# Patient Record
Sex: Male | Born: 1960 | Race: White | Hispanic: No | Marital: Married | Smoking: Never smoker
Health system: Southern US, Community
[De-identification: ages and names within clinical notes are randomized; demographics above are authoritative.]

## PROBLEM LIST (undated history)

## (undated) DIAGNOSIS — M545 Low back pain, unspecified: Secondary | ICD-10-CM

## (undated) DIAGNOSIS — R7303 Prediabetes: Secondary | ICD-10-CM

## (undated) DIAGNOSIS — F419 Anxiety disorder, unspecified: Secondary | ICD-10-CM

## (undated) DIAGNOSIS — M1990 Unspecified osteoarthritis, unspecified site: Secondary | ICD-10-CM

## (undated) DIAGNOSIS — F329 Major depressive disorder, single episode, unspecified: Secondary | ICD-10-CM

## (undated) DIAGNOSIS — F32A Depression, unspecified: Secondary | ICD-10-CM

## (undated) DIAGNOSIS — E785 Hyperlipidemia, unspecified: Secondary | ICD-10-CM

## (undated) DIAGNOSIS — D693 Immune thrombocytopenic purpura: Secondary | ICD-10-CM

## (undated) DIAGNOSIS — K589 Irritable bowel syndrome without diarrhea: Secondary | ICD-10-CM

## (undated) DIAGNOSIS — G4733 Obstructive sleep apnea (adult) (pediatric): Secondary | ICD-10-CM

## (undated) DIAGNOSIS — G8929 Other chronic pain: Secondary | ICD-10-CM

## (undated) HISTORY — DX: Low back pain, unspecified: M54.50

## (undated) HISTORY — DX: Depression, unspecified: F32.A

## (undated) HISTORY — DX: Other chronic pain: G89.29

## (undated) HISTORY — DX: Immune thrombocytopenic purpura: D69.3

## (undated) HISTORY — DX: Major depressive disorder, single episode, unspecified: F32.9

## (undated) HISTORY — DX: Irritable bowel syndrome, unspecified: K58.9

## (undated) HISTORY — DX: Low back pain: M54.5

## (undated) HISTORY — PX: TONSILLECTOMY: SUR1361

## (undated) HISTORY — PX: NASAL SINUS SURGERY: SHX719

## (undated) HISTORY — PX: COLONOSCOPY: SHX174

## (undated) HISTORY — DX: Obstructive sleep apnea (adult) (pediatric): G47.33

## (undated) HISTORY — DX: Hyperlipidemia, unspecified: E78.5

## (undated) HISTORY — PX: LAMINECTOMY: SHX219

## (undated) HISTORY — DX: Anxiety disorder, unspecified: F41.9

## (undated) HISTORY — PX: SPINE SURGERY: SHX786

---

## 1998-05-10 ENCOUNTER — Ambulatory Visit (HOSPITAL_COMMUNITY): Admission: RE | Admit: 1998-05-10 | Discharge: 1998-05-10 | Payer: Self-pay | Admitting: Neurosurgery

## 1998-05-21 ENCOUNTER — Inpatient Hospital Stay (HOSPITAL_COMMUNITY): Admission: RE | Admit: 1998-05-21 | Discharge: 1998-05-25 | Payer: Self-pay | Admitting: Neurosurgery

## 1998-08-07 ENCOUNTER — Emergency Department (HOSPITAL_COMMUNITY): Admission: EM | Admit: 1998-08-07 | Discharge: 1998-08-07 | Payer: Self-pay | Admitting: Emergency Medicine

## 1998-12-24 ENCOUNTER — Ambulatory Visit (HOSPITAL_COMMUNITY): Admission: RE | Admit: 1998-12-24 | Discharge: 1998-12-24 | Payer: Self-pay | Admitting: Neurosurgery

## 1998-12-24 ENCOUNTER — Encounter: Payer: Self-pay | Admitting: Neurosurgery

## 2001-01-17 ENCOUNTER — Encounter: Payer: Self-pay | Admitting: Family Medicine

## 2001-01-17 ENCOUNTER — Encounter: Admission: RE | Admit: 2001-01-17 | Discharge: 2001-01-17 | Payer: Self-pay | Admitting: Family Medicine

## 2004-01-04 ENCOUNTER — Emergency Department (HOSPITAL_COMMUNITY): Admission: EM | Admit: 2004-01-04 | Discharge: 2004-01-04 | Payer: Self-pay | Admitting: Emergency Medicine

## 2011-01-17 ENCOUNTER — Encounter: Payer: Self-pay | Admitting: Neurosurgery

## 2011-01-19 ENCOUNTER — Encounter: Payer: Self-pay | Admitting: Internal Medicine

## 2011-03-18 ENCOUNTER — Encounter: Payer: Self-pay | Admitting: Pulmonary Disease

## 2011-03-19 ENCOUNTER — Encounter: Payer: Self-pay | Admitting: Pulmonary Disease

## 2011-03-19 ENCOUNTER — Ambulatory Visit (INDEPENDENT_AMBULATORY_CARE_PROVIDER_SITE_OTHER): Payer: BC Managed Care – PPO | Admitting: Pulmonary Disease

## 2011-03-19 VITALS — BP 120/80 | HR 76 | Temp 99.0°F | Ht 70.0 in | Wt 209.8 lb

## 2011-03-19 DIAGNOSIS — G4733 Obstructive sleep apnea (adult) (pediatric): Secondary | ICD-10-CM | POA: Insufficient documentation

## 2011-03-19 NOTE — Patient Instructions (Signed)
Will put your machine on auto mode for next 2-3 weeks, and get download to optimize your pressure.  Will let you know the results.  Please call us when the machine is downloaded so we can track down. Work on weight loss

## 2011-03-19 NOTE — Assessment & Plan Note (Signed)
The pt has a h/o moderate osa by his prior sleep study, and has been on cpap for this.  He is now having occasional breakthru snoring, and doesn't feel he is as rested as he should be.  He really doesn't describe true daytime sleepiness unless he is doing quite activities such as reading or watching "uninteresting tv", and feels he has more of a concentration issue?  He has not had his pressure optimized since original study, and has gained weight.  I think the first step here is to optimize his pressure on auto and see if he improves.

## 2011-03-19 NOTE — Progress Notes (Signed)
Subjective:    Patient ID: Russell Hayden, male    DOB: July 26, 1961, 50 y.o.   MRN: [id number]  HPI The pt is a 50y/o male who I have been asked to see for management of moderate osa.  Please see sleep questionnaire further in the note.  He is wearing cpap compliantly at 10cm, and is using a nasal mask.  He has a new machine, and has kept up with mask changes.  He averages 10hrs a night with cpap, but at times does not feel rested upon arising.  He has an accelerometer that he sometimes uses as actigraphy, and reports it shows 10-20 arousals during the night??.he only knows of 2-3 time a night.  He denies having true daytime sleepiness with periods of inactivity, although states his epworth is 17.  He denies sleepiness at his desk at [employer], computers, meetings, etc.  He will fall asleep if tries to read a book.  He does not feel he has true daytime SLEEPINESS.  His weight is up 25 pounds since his sleep study.  He is currently taking nuvigil during day as needed, and doesn't feel it helps except in afternoons when not busy.    Review of Systems  Constitutional: Negative for fever, appetite change and unexpected weight change.  HENT: Negative for ear pain, congestion, sore throat, rhinorrhea, sneezing, trouble swallowing, dental problem and postnasal drip.   Eyes: Negative for redness.  Respiratory: Negative for cough, shortness of breath and wheezing.   Cardiovascular: Negative for chest pain, palpitations and leg swelling.  Gastrointestinal: Negative for nausea, abdominal pain and diarrhea.  Genitourinary: Negative for dysuria.  Musculoskeletal: Positive for joint swelling.  Skin: Negative for rash.  Neurological: Negative for syncope and headaches.  Hematological: Does not bruise/bleed easily.  Psychiatric/Behavioral: Positive for dysphoric mood. The patient is nervous/anxious.        Objective:   Physical Exam  Constitutional: He is oriented to person, place, and time. No distress.   Ow male  HENT:  Head: Atraumatic.  Nose: Nose normal.  Mouth/Throat: Oropharyngeal exudate present.       Trimmed palate and uvula  Eyes: EOM are normal. Pupils are equal, round, and reactive to light.  Neck: Neck supple. No JVD present. No tracheal deviation present. No thyromegaly present.  Cardiovascular: Normal rate, regular rhythm, normal heart sounds and intact distal pulses.  Exam reveals no gallop and no friction rub.   No murmur heard. Pulmonary/Chest: Breath sounds normal. No respiratory distress. He has no wheezes. He has no rales.  Abdominal: Soft. Bowel sounds are normal. There is no tenderness.  Musculoskeletal: He exhibits no edema.  Lymphadenopathy:    He has no cervical adenopathy.  Neurological: He is alert and oriented to person, place, and time.       Moves all 4, does not appear sleepy  Skin:       No cyanosis          Assessment & Plan:  OSA (obstructive sleep apnea) The pt has a h/o moderate osa by his prior sleep study, and has been on cpap for this.  He is now having occasional breakthru snoring, and doesn't feel he is as rested as he should be.  He really doesn't describe true daytime sleepiness unless he is doing quite activities such as reading or watching "uninteresting tv", and feels he has more of a concentration issue?  He has not had his pressure optimized since original study, and has gained weight.  I think the  first step here is to optimize his pressure on auto and see if he improves.

## 2011-04-13 ENCOUNTER — Encounter: Payer: Self-pay | Admitting: Pulmonary Disease

## 2011-09-27 ENCOUNTER — Other Ambulatory Visit: Payer: Self-pay | Admitting: Pulmonary Disease

## 2011-09-27 DIAGNOSIS — G4733 Obstructive sleep apnea (adult) (pediatric): Secondary | ICD-10-CM

## 2011-11-26 ENCOUNTER — Other Ambulatory Visit: Payer: Self-pay | Admitting: Internal Medicine

## 2011-11-26 DIAGNOSIS — R0989 Other specified symptoms and signs involving the circulatory and respiratory systems: Secondary | ICD-10-CM

## 2011-11-30 ENCOUNTER — Ambulatory Visit
Admission: RE | Admit: 2011-11-30 | Discharge: 2011-11-30 | Disposition: A | Discharge status: Home / Self Care | Payer: BC Managed Care – PPO | Source: Ambulatory Visit | Attending: Internal Medicine | Admitting: Internal Medicine

## 2011-11-30 DIAGNOSIS — R0989 Other specified symptoms and signs involving the circulatory and respiratory systems: Secondary | ICD-10-CM

## 2012-10-23 ENCOUNTER — Ambulatory Visit: Payer: BC Managed Care – PPO

## 2012-10-23 ENCOUNTER — Ambulatory Visit (INDEPENDENT_AMBULATORY_CARE_PROVIDER_SITE_OTHER): Payer: BC Managed Care – PPO | Admitting: Internal Medicine

## 2012-10-23 VITALS — BP 124/76 | HR 91 | Temp 98.3°F | Resp 16 | Ht 69.5 in | Wt 218.4 lb

## 2012-10-23 DIAGNOSIS — S93529A Sprain of metatarsophalangeal joint of unspecified toe(s), initial encounter: Secondary | ICD-10-CM

## 2012-10-23 DIAGNOSIS — M79673 Pain in unspecified foot: Secondary | ICD-10-CM

## 2012-10-23 DIAGNOSIS — S96912A Strain of unspecified muscle and tendon at ankle and foot level, left foot, initial encounter: Secondary | ICD-10-CM

## 2012-10-23 DIAGNOSIS — M79609 Pain in unspecified limb: Secondary | ICD-10-CM

## 2012-10-23 NOTE — Progress Notes (Signed)
  Subjective:    Patient ID: Russell Hayden, male    DOB: 1961-08-31, 51 y.o.   MRN: [id number]  HPI At home last week and twisted foot in attic. Has pain mid foot when walks. No loss rom.  Review of Systems     Objective:   Physical Exam Left foot not swollen, nmsv intact, full rom Point tender mid foot mid metatarsals  [facility] reading (PRIMARY) by  Dr.Nikolette Reindl. No fx          Assessment & Plan:  Left foot strain camwalker

## 2012-10-23 NOTE — Patient Instructions (Addendum)
Foot Sprain The muscles and cord like structures which attach muscle to bone (tendons) that surround the feet are made up of units. A foot sprain can occur at the weakest spot in any of these units. This condition is most often caused by injury to or overuse of the foot, as from playing contact sports, or aggravating a previous injury, or from poor conditioning, or obesity. SYMPTOMS  Pain with movement of the foot.  Tenderness and swelling at the injury site.  Loss of strength is present in moderate or severe sprains. THE THREE GRADES OR SEVERITY OF FOOT SPRAIN ARE:  Mild (Grade I): Slightly pulled muscle without tearing of muscle or tendon fibers or loss of strength.  Moderate (Grade II): Tearing of fibers in a muscle, tendon, or at the attachment to bone, with small decrease in strength.  Severe (Grade III): Rupture of the muscle-tendon-bone attachment, with separation of fibers. Severe sprain requires surgical repair. Often repeating (chronic) sprains are caused by overuse. Sudden (acute) sprains are caused by direct injury or over-use. DIAGNOSIS  Diagnosis of this condition is usually by your own observation. If problems continue, a caregiver may be required for further evaluation and treatment. X-rays may be required to make sure there are not breaks in the bones (fractures) present. Continued problems may require physical therapy for treatment. PREVENTION  Use strength and conditioning exercises appropriate for your sport.  Warm up properly prior to working out.  Use athletic shoes that are made for the sport you are participating in.  Allow adequate time for healing. Early return to activities makes repeat injury more likely, and can lead to an unstable arthritic foot that can result in prolonged disability. Mild sprains generally heal in 3 to 10 days, with moderate and severe sprains taking 2 to 10 weeks. Your caregiver can help you determine the proper time required for  healing. HOME CARE INSTRUCTIONS   Apply ice to the injury for 15 to 20 minutes, 3 to 4 times per day. Put the ice in a plastic bag and place a towel between the bag of ice and your skin.  An elastic wrap (like an Ace bandage) may be used to keep swelling down.  Keep foot above the level of the heart, or at least raised on a footstool, when swelling and pain are present.  Try to avoid use other than gentle range of motion while the foot is painful. Do not resume use until instructed by your caregiver. Then begin use gradually, not increasing use to the point of pain. If pain does develop, decrease use and continue the above measures, gradually increasing activities that do not cause discomfort, until you gradually achieve normal use.  Use crutches if and as instructed, and for the length of time instructed.  Keep injured foot and ankle wrapped between treatments.  Massage foot and ankle for comfort and to keep swelling down. Massage from the toes up towards the knee.  Only take over-the-counter or prescription medicines for pain, discomfort, or fever as directed by your caregiver. SEEK IMMEDIATE MEDICAL CARE IF:   Your pain and swelling increase, or pain is not controlled with medications.  You have loss of feeling in your foot or your foot turns cold or blue.  You develop new, unexplained symptoms, or an increase of the symptoms that brought you to your caregiver. MAKE SURE YOU:   Understand these instructions.  Will watch your condition.  Will get help right away if you are not doing well or   get worse. Document Released: 06/05/2002 Document Revised: 03/07/2012 Document Reviewed: 08/02/2008 [employer] Patient Information 2013 [employer].  

## 2013-01-30 ENCOUNTER — Ambulatory Visit: Payer: BC Managed Care – PPO

## 2013-01-30 ENCOUNTER — Ambulatory Visit (INDEPENDENT_AMBULATORY_CARE_PROVIDER_SITE_OTHER): Payer: BC Managed Care – PPO | Admitting: Family Medicine

## 2013-01-30 ENCOUNTER — Other Ambulatory Visit: Payer: Self-pay | Admitting: Family Medicine

## 2013-01-30 VITALS — BP 116/73 | HR 82 | Temp 98.0°F | Resp 18 | Ht 69.0 in | Wt 208.0 lb

## 2013-01-30 DIAGNOSIS — M25569 Pain in unspecified knee: Secondary | ICD-10-CM

## 2013-01-30 DIAGNOSIS — D649 Anemia, unspecified: Secondary | ICD-10-CM

## 2013-01-30 LAB — POCT CBC
Lymph, poc: 1.5 (ref 0.6–3.4)
MCHC: 31.2 g/dL — AB (ref 31.8–35.4)
MID (cbc): 0.4 (ref 0–0.9)
MPV: 8.3 fL (ref 0–99.8)
POC Granulocyte: 2.4 (ref 2–6.9)
POC LYMPH PERCENT: 34.8 %L (ref 10–50)
POC MID %: 8.6 %M (ref 0–12)
Platelet Count, POC: 199 10*3/uL (ref 142–424)
RDW, POC: 13.8 %

## 2013-01-30 LAB — URIC ACID: Uric Acid, Serum: 5.2 mg/dL (ref 4.0–7.8)

## 2013-01-30 NOTE — Progress Notes (Signed)
[facility] [address] [place] [address] [id number] [phone]  Date:  01/30/2013   Name:  Russell Hayden   DOB:  [birth year]   MRN:  [id number]  PCP:  Londell Moh, MD    Chief Complaint: Knee Pain   History of Present Illness:  Russell Hayden is a 52 y.o. very pleasant male patient who presents with the following:  He is here today with a left knee problem.  He has noted pain and stiffness in the knee for a couple of months, but worse for the last 2 days.  It feels unstable, but has not actually failed or given way.  He has not had any particular injury to the knee.   He does a lot of walking for exercise- about 8 miles a day.  He also played golf yesterday and that put a lot of pressure on his knee.  He took medication yesterday prior to his golf game- diclofenac  He has been diagnosed with OA in the past and does tend to have some generalized joint pains.  He had spine surgery and "rods" as a young man- no current orthopedic care.    Patient Active Problem List  Diagnosis  . OSA (obstructive sleep apnea)    Past Medical History  Diagnosis Date  . Hyperlipidemia   . OSA (obstructive sleep apnea)   . IBS (irritable bowel syndrome)   . Anxiety   . Depression   . Chronic low back pain   . Allergic rhinitis   . Idiopathic thrombocytopenia     Past Surgical History  Procedure Date  . Laminectomy     approx 2000  . Nasal sinus surgery     approx late 1990s.  deviated septum  . Spine surgery     History  Substance Use Topics  . Smoking status: Never Smoker   . Smokeless tobacco: Not on file  . Alcohol Use: Not on file    Family History  Problem Relation Age of Onset  . Hyperlipidemia Mother   . Hypertension Mother   . Celiac disease Mother   . Hypertension Brother   . Celiac disease Brother   . Leukemia Father   . Skin cancer Father   . Cancer Paternal Grandmother   . Asthma Mother   . Allergies Mother     Allergies  Allergen  Reactions  . Penicillins     Itching and rash    Medication list has been reviewed and updated.  Current Outpatient Prescriptions on File Prior to Visit  Medication Sig Dispense Refill  . Armodafinil (NUVIGIL) 150 MG tablet Take 150 mg by mouth daily.        . fish oil-omega-3 fatty acids 1000 MG capsule Take 2 g by mouth daily.       . rosuvastatin (CRESTOR) 20 MG tablet Take 20 mg by mouth daily.        . sertraline (ZOLOFT) 100 MG tablet Take 100 mg by mouth daily.          Review of Systems:  As per HPI- otherwise negative.   Physical Examination: Filed Vitals:   01/30/13 0953  BP: 116/73  Pulse: 82  Temp: 98 F (36.7 C)  Resp: 18   Filed Vitals:   01/30/13 0953  Height: 5\' 9"  (1.753 m)  Weight: 208 lb (94.348 kg)   Body mass index is 30.72 kg/(m^2). Ideal Body Weight: Weight in (lb) to have BMI = 25: 168.9   GEN:  WDWN, NAD, Non-toxic, A & O x 3, overweight HEENT: Atraumatic, Normocephalic. Neck supple. No masses, No LAD. Ears and Nose: No external deformity. CV: RRR, No M/G/R. No JVD. No thrill. No extra heart sounds. PULM: CTA B, no wheezes, crackles, rhonchi. No retractions. No resp. distress. No accessory muscle use. EXTR: No c/c/e NEURO Normal gait.  PSYCH: Normally interactive. Conversant. Not depressed or anxious appearing.  Calm demeanor.  Left knee: pain with flexion, able to extend fully.  Knee is slightly warm but not red. He has pain with valgus stress, varus stress ok.  Negative anterior drawer.    [facility] reading (PRIMARY) by  Dr. Patsy Lager. Left knee: minimal to no degenerative change  Results for orders placed in visit on 01/30/13  POCT CBC      Component Value Range   WBC 4.2 (*) 4.6 - 10.2 K/uL   Lymph, poc 1.5  0.6 - 3.4   POC LYMPH PERCENT 34.8  10 - 50 %L   MID (cbc) 0.4  0 - 0.9   POC MID % 8.6  0 - 12 %M   POC Granulocyte 2.4  2 - 6.9   Granulocyte percent 56.6  37 - 80 %G   RBC 4.66 (*) 4.69 - 6.13 M/uL   Hemoglobin 13.7 (*) 14.1 -  18.1 g/dL   HCT, POC 16.1  09.6 - 53.7 %   MCV 94.2  80 - 97 fL   MCH, POC 29.4  27 - 31.2 pg   MCHC 31.2 (*) 31.8 - 35.4 g/dL   RDW, POC 04.5     Platelet Count, POC 199  142 - 424 K/uL   MPV 8.3  0 - 99.8 fL   Assessment and Plan: 1. Knee pain  POCT CBC, Uric Acid, DG Knee Complete 4 Views Right, Ambulatory referral to Orthopedic Surgery   Suspect a meniscal tear. Place hinged knee brace, prompt referral to ortho.  voltaren as needed for pain.   (of note his SO pulled me aside and asked me not to rx narcotis as he "tends to take too much")  Mild anemia- he will follow- up with his PCP.  See patient instructions for more details.     Abbe Amsterdam, MD

## 2013-01-30 NOTE — Patient Instructions (Addendum)
We will be in touch with you regarding your uric acid level- if it is high we will start medicine for gout.  Use your knee brace as needed, ice and elevate when you can.    IF your knee becomes very painful, hot, or red you could have a joint infection- let someone know right away if any of these signs occur  You can use your diclofenac as needed for your knee pain  Discuss your blood count (CBC) with Dr. Renne Crigler in the next couple of months.  You are slightly anemia- it is important to be sure your colonoscopy is up to date.  If you have not had one in 10 years you are probably due

## 2013-01-31 ENCOUNTER — Other Ambulatory Visit: Payer: Self-pay | Admitting: Orthopedic Surgery

## 2013-01-31 ENCOUNTER — Encounter (HOSPITAL_BASED_OUTPATIENT_CLINIC_OR_DEPARTMENT_OTHER): Payer: Self-pay | Admitting: *Deleted

## 2013-01-31 NOTE — H&P (Signed)
  History of Present Illness: Russell Hayden is a 52 year old established patient who comes in today with a new problem.  Complains of left knee pain.  An present for the last 4-5 months.  Denies any specific injury.  Pain is gotten worse over the past week.  A few days ago while golfing he took a diclofenac 75 mg tablets and less than a 12 hour.  Due to his discomfort.  He is in a walking program and has been walking 7-8 miles per day over the last couple years.  He denies any swelling.  His been limping.  His knee is very stiff after getting up from a seated position.  He also is very active with doing yoga and Pilates.  Yesterday he he was limited in his Pilates class due to his pain.   PMHx: Hyperlipidemia.  Hearing loss..  Medications: Crestor.  Allergies: Penicillin.  Surgical history: Status post lumbar laminectomy several years ago..     ROS: He wears glasses.  Sleep disorder with sleep apnea.  The remainder of the 11 point review of system is negative other than what been noted above.Marland Kitchen  SoHx: He is married.  He is is employed as an Art gallery manager.  Denies tobacco or alcohol use.  Exam: In general the patient appears healthy no acute distress alert and oriented. Mood stable and normal affect.  Weight: 205 pounds.  Height: 5 feet 9 inches.  BMI: And 30.3.  Examination of his left knee reveals there is trace effusion.  Good range of motion.  He does have a painful arc of motion.  Tenderness palpation towards the medial aspect.  McMurray significant for pain.  Normal valgus and varus stress.  Negative Lachman's.  No popliteal masses no fullness appreciated.  No patella instability.  Neuro exam: 5/5 strength in the lower extremity.  Light touch sensation is intact.  He does have an altered afebrile left side..  Skin exam: No signs of this infection.  No breakdown.  No rash. Respiratory exam: The patient speaks in full sentences.  No signs of stress.  No use of accessory muscles.  Vascular exam: Skin is appropriately  warm to touch.  No swelling.  No signs of compromise.  X-rays: X-rays were ordered and reviewed by me today.  Three-view x-ray of his left knee reveals he has relatively preserved joint spacing.  There is some mild loss of spacing of the medial side.  There is no spurring noted.  Musculoskeletal ultrasound: Images of the medial, lateral and posterior and anterior aspect of the knee were obtained.  Along the medial compartment this reveals an extruded and poorly visualized posterior medial meniscus structure.  Along the Anterior and mid anterior region of the medial meniscus he is noted to have a normal-appearing triangular meniscal structure.  Evaluation of the lateral meniscus also reveals a normal-appearing meniscal structure.  No popliteal masses were appreciated.  The patella tendon is intact.  Assessment: 1.  Chronic left knee pain with concern for degenerative medial meniscal injury towards posterior medial aspect.  Procedure: None  Plan: We discussed injection therapy versus a more definitive treatment with surgical intervention.  He declined the expected temporary relief he will get from a steroid injection and opted for surgical consultation.  With this in mind he was discussed with Dr. Turner Daniels who has arranged for him to undergo a left knee arthroscopy.  Risks and benefits of surgery were discussed in detail and he would like to proceed.

## 2013-01-31 NOTE — Progress Notes (Signed)
No heart or resp problems-does use a cpap-will bring dos and use post op

## 2013-02-01 ENCOUNTER — Ambulatory Visit (HOSPITAL_BASED_OUTPATIENT_CLINIC_OR_DEPARTMENT_OTHER): Payer: BC Managed Care – PPO | Admitting: Certified Registered"

## 2013-02-01 ENCOUNTER — Encounter (HOSPITAL_BASED_OUTPATIENT_CLINIC_OR_DEPARTMENT_OTHER): Payer: Self-pay | Admitting: Certified Registered"

## 2013-02-01 ENCOUNTER — Encounter (HOSPITAL_BASED_OUTPATIENT_CLINIC_OR_DEPARTMENT_OTHER)
Admission: RE | Disposition: A | Discharge status: Home / Self Care | Payer: Self-pay | Source: Ambulatory Visit | Attending: Orthopedic Surgery

## 2013-02-01 ENCOUNTER — Ambulatory Visit (HOSPITAL_BASED_OUTPATIENT_CLINIC_OR_DEPARTMENT_OTHER)
Admission: RE | Admit: 2013-02-01 | Discharge: 2013-02-01 | Disposition: A | Discharge status: Home / Self Care | Payer: BC Managed Care – PPO | Source: Ambulatory Visit | Attending: Orthopedic Surgery | Admitting: Orthopedic Surgery

## 2013-02-01 ENCOUNTER — Encounter (HOSPITAL_BASED_OUTPATIENT_CLINIC_OR_DEPARTMENT_OTHER): Payer: Self-pay | Admitting: *Deleted

## 2013-02-01 ENCOUNTER — Encounter: Payer: Self-pay | Admitting: Family Medicine

## 2013-02-01 DIAGNOSIS — M23329 Other meniscus derangements, posterior horn of medial meniscus, unspecified knee: Secondary | ICD-10-CM | POA: Insufficient documentation

## 2013-02-01 DIAGNOSIS — S83209A Unspecified tear of unspecified meniscus, current injury, unspecified knee, initial encounter: Secondary | ICD-10-CM

## 2013-02-01 DIAGNOSIS — E785 Hyperlipidemia, unspecified: Secondary | ICD-10-CM | POA: Insufficient documentation

## 2013-02-01 HISTORY — PX: KNEE ARTHROSCOPY: SHX127

## 2013-02-01 LAB — POCT HEMOGLOBIN-HEMACUE: Hemoglobin: 15.3 g/dL (ref 13.0–17.0)

## 2013-02-01 SURGERY — ARTHROSCOPY, KNEE
Anesthesia: General | Site: Knee | Laterality: Left | Wound class: Clean

## 2013-02-01 MED ORDER — LIDOCAINE HCL (CARDIAC) 20 MG/ML IV SOLN
INTRAVENOUS | Status: DC | PRN
  Administered 2013-02-01: 60 mg via INTRAVENOUS

## 2013-02-01 MED ORDER — DEXAMETHASONE SODIUM PHOSPHATE 4 MG/ML IJ SOLN
INTRAMUSCULAR | Status: DC | PRN
  Administered 2013-02-01: 10 mg via INTRAVENOUS

## 2013-02-01 MED ORDER — ONDANSETRON HCL 4 MG/2ML IJ SOLN
INTRAMUSCULAR | Status: DC | PRN
  Administered 2013-02-01: 4 mg via INTRAVENOUS

## 2013-02-01 MED ORDER — EPINEPHRINE HCL 1 MG/ML IJ SOLN
INTRAMUSCULAR | Status: DC | PRN
  Administered 2013-02-01: 1 mg

## 2013-02-01 MED ORDER — MIDAZOLAM HCL 5 MG/5ML IJ SOLN
INTRAMUSCULAR | Status: DC | PRN
  Administered 2013-02-01: 1 mg via INTRAVENOUS

## 2013-02-01 MED ORDER — HYDROMORPHONE HCL PF 1 MG/ML IJ SOLN
0.2500 mg | INTRAMUSCULAR | Status: DC | PRN
  Administered 2013-02-01 (×4): 0.5 mg via INTRAVENOUS

## 2013-02-01 MED ORDER — OXYCODONE HCL 5 MG PO TABS: 5.0000 mg | ORAL_TABLET | Freq: Once | ORAL | Status: DC | PRN

## 2013-02-01 MED ORDER — PROPOFOL 10 MG/ML IV BOLUS
INTRAVENOUS | Status: DC | PRN
  Administered 2013-02-01: 300 mg via INTRAVENOUS

## 2013-02-01 MED ORDER — CHLORHEXIDINE GLUCONATE 4 % EX LIQD: 60.0000 mL | Freq: Once | CUTANEOUS | Status: DC

## 2013-02-01 MED ORDER — MIDAZOLAM HCL 2 MG/2ML IJ SOLN: 1.0000 mg | INTRAMUSCULAR | Status: DC | PRN

## 2013-02-01 MED ORDER — GLYCOPYRROLATE 0.2 MG/ML IJ SOLN
INTRAMUSCULAR | Status: DC | PRN
  Administered 2013-02-01: 0.2 mg via INTRAVENOUS

## 2013-02-01 MED ORDER — SODIUM CHLORIDE 0.9 % IR SOLN
Status: DC | PRN
  Administered 2013-02-01: 3000 mL

## 2013-02-01 MED ORDER — OXYCODONE HCL 5 MG PO TABS
10.0000 mg | ORAL_TABLET | Freq: Once | ORAL | Status: AC | PRN
  Administered 2013-02-01: 10 mg via ORAL

## 2013-02-01 MED ORDER — DEXTROSE-NACL 5-0.45 % IV SOLN: INTRAVENOUS | Status: DC

## 2013-02-01 MED ORDER — LACTATED RINGERS IV SOLN
INTRAVENOUS | Status: DC
Start: 2013-02-01 — End: 2013-02-01
  Administered 2013-02-01: 11:00:00 via INTRAVENOUS
  Administered 2013-02-01: 20 mL/h via INTRAVENOUS

## 2013-02-01 MED ORDER — BUPIVACAINE-EPINEPHRINE 0.5% -1:200000 IJ SOLN
INTRAMUSCULAR | Status: DC | PRN
  Administered 2013-02-01: 20 mL

## 2013-02-01 MED ORDER — VANCOMYCIN HCL IN DEXTROSE 1-5 GM/200ML-% IV SOLN
1000.0000 mg | INTRAVENOUS | Status: AC
  Administered 2013-02-01: 1000 mg via INTRAVENOUS

## 2013-02-01 MED ORDER — FENTANYL CITRATE 0.05 MG/ML IJ SOLN: 50.0000 ug | INTRAMUSCULAR | Status: DC | PRN

## 2013-02-01 MED ORDER — FENTANYL CITRATE 0.05 MG/ML IJ SOLN
INTRAMUSCULAR | Status: DC | PRN
  Administered 2013-02-01: 25 ug via INTRAVENOUS
  Administered 2013-02-01: 50 ug via INTRAVENOUS
  Administered 2013-02-01: 25 ug via INTRAVENOUS

## 2013-02-01 MED ORDER — MIDAZOLAM HCL 2 MG/ML PO SYRP: 0.5000 mg/kg | ORAL_SOLUTION | Freq: Once | ORAL | Status: DC | PRN

## 2013-02-01 MED ORDER — OXYCODONE HCL 5 MG/5ML PO SOLN: 5.0000 mg | Freq: Once | ORAL | Status: AC | PRN

## 2013-02-01 MED ORDER — OXYCODONE HCL 5 MG/5ML PO SOLN: 5.0000 mg | Freq: Once | ORAL | Status: DC | PRN

## 2013-02-01 MED ORDER — HYDROCODONE-ACETAMINOPHEN 5-325 MG PO TABS: 1.0000 | ORAL_TABLET | ORAL | Status: AC | PRN

## 2013-02-01 SURGICAL SUPPLY — 39 items
BANDAGE ELASTIC 6 VELCRO ST LF (GAUZE/BANDAGES/DRESSINGS) ×2 IMPLANT
BLADE 4.2CUDA (BLADE) IMPLANT
BLADE CUTTER GATOR 3.5 (BLADE) ×2 IMPLANT
BLADE GREAT WHITE 4.2 (BLADE) IMPLANT
CANISTER OMNI JUG 16 LITER (MISCELLANEOUS) ×2 IMPLANT
CANISTER SUCTION 2500CC (MISCELLANEOUS) IMPLANT
CHLORAPREP W/TINT 26ML (MISCELLANEOUS) ×2 IMPLANT
CLOTH BEACON ORANGE TIMEOUT ST (SAFETY) ×2 IMPLANT
DRAPE ARTHROSCOPY W/POUCH 114 (DRAPES) ×2 IMPLANT
ELECT MENISCUS 165MM 90D (ELECTRODE) IMPLANT
ELECT REM PT RETURN 9FT ADLT (ELECTROSURGICAL)
ELECTRODE REM PT RTRN 9FT ADLT (ELECTROSURGICAL) IMPLANT
GAUZE XEROFORM 1X8 LF (GAUZE/BANDAGES/DRESSINGS) ×2 IMPLANT
GLOVE BIO SURGEON STRL SZ 6.5 (GLOVE) ×2 IMPLANT
GLOVE BIO SURGEON STRL SZ7 (GLOVE) ×4 IMPLANT
GLOVE BIO SURGEON STRL SZ7.5 (GLOVE) ×2 IMPLANT
GLOVE BIOGEL PI IND STRL 7.0 (GLOVE) ×2 IMPLANT
GLOVE BIOGEL PI IND STRL 8 (GLOVE) ×1 IMPLANT
GLOVE BIOGEL PI INDICATOR 7.0 (GLOVE) ×2
GLOVE BIOGEL PI INDICATOR 8 (GLOVE) ×1
GOWN PREVENTION PLUS XLARGE (GOWN DISPOSABLE) ×4 IMPLANT
IV NS IRRIG 3000ML ARTHROMATIC (IV SOLUTION) ×2 IMPLANT
KNEE WRAP E Z 3 GEL PACK (MISCELLANEOUS) ×2 IMPLANT
NDL SAFETY ECLIPSE 18X1.5 (NEEDLE) ×1 IMPLANT
NEEDLE FILTER BLUNT 18X 1/2SAF (NEEDLE)
NEEDLE FILTER BLUNT 18X1 1/2 (NEEDLE) IMPLANT
NEEDLE HYPO 18GX1.5 SHARP (NEEDLE) ×1
PACK ARTHROSCOPY DSU (CUSTOM PROCEDURE TRAY) ×2 IMPLANT
PACK BASIN DAY SURGERY FS (CUSTOM PROCEDURE TRAY) ×2 IMPLANT
PENCIL BUTTON HOLSTER BLD 10FT (ELECTRODE) IMPLANT
SET ARTHROSCOPY TUBING (MISCELLANEOUS) ×1
SET ARTHROSCOPY TUBING LN (MISCELLANEOUS) ×1 IMPLANT
SLEEVE SCD COMPRESS KNEE MED (MISCELLANEOUS) IMPLANT
SPONGE GAUZE 4X4 12PLY (GAUZE/BANDAGES/DRESSINGS) IMPLANT
SYR 3ML 18GX1 1/2 (SYRINGE) IMPLANT
SYR 5ML LL (SYRINGE) ×2 IMPLANT
TOWEL OR 17X24 6PK STRL BLUE (TOWEL DISPOSABLE) ×2 IMPLANT
WAND STAR VAC 90 (SURGICAL WAND) IMPLANT
WATER STERILE IRR 1000ML POUR (IV SOLUTION) ×2 IMPLANT

## 2013-02-01 NOTE — Interval H&P Note (Signed)
History and Physical Interval Note:  02/01/2013 11:40 AM  Russell Hayden  has presented today for surgery, with the diagnosis of Left Medial Meniscal Tear  The various methods of treatment have been discussed with the patient and family. After consideration of risks, benefits and other options for treatment, the patient has consented to  Procedure(s) (LRB) with comments: ARTHROSCOPY KNEE (Left) as a surgical intervention .  The patient's history has been reviewed, patient examined, no change in status, stable for surgery.  I have reviewed the patient's chart and labs.  Questions were answered to the patient's satisfaction.     Nestor Lewandowsky

## 2013-02-01 NOTE — Op Note (Signed)
Pre-Op Dx:L knee medial meniscal tear  Postop Dx: Same   Procedure: Left knee partial arthroscopic medial meniscectomy  Surgeon: Feliberto Gottron. Turner Daniels M.D.  Assist: Shirl Harris PA-C  Anes: General LMA  EBL: Minimal  Fluids: 800 cc   Indications: 2 month history of catching popping and pain in the left knee with intermittent locking episodes. Very noticeable when he has a golf swing, getting worse over the last 3-4 weeks.. Pt has failed conservative treatment with anti-inflammatory medicines, physical therapy, and modified activites but did get good temporarily from an intra-articular cortisone injection. Pain has recurred and patient desires elective arthroscopic evaluation and treatment of knee. Risks and benefits of surgery have been discussed and questions answered.  Procedure: Patient identified by arm band and taken to the operating room at the day surgery Center. The appropriate anesthetic monitors were attached, and General LMA anesthesia was induced without difficulty. Lateral post was applied to the table and the lower extremity was prepped and draped in usual sterile fashion from the ankle to the midthigh. Time out procedure was performed. We began the operation by making standard inferior lateral and inferior medial peripatellar portals with a #11 blade allowing introduction of the arthroscope through the inferior lateral portal and the out flow to the inferior medial portal. Pump pressure was set at 100 mmHg and diagnostic arthroscopy  revealed a normal patellofemoral joint, normal lateral compartment normal cruciate ligaments. The medial compartment had normal articular cartilage as well as a large. He tear the posterior medial horn of the medial meniscus. This was removed with the straight biters and a 35 Gator sucker shaver. The parrot-beak tear extended from the root to mid medial. The gutters were cleared medially and laterally.. The knee was irrigated out normal saline solution. A  dressing of xerofoam 4 x 4 dressing sponges, web roll and an Ace wrap was applied. The patient was awakened extubated and taken to the recovery without difficulty.    Signed: Nestor Lewandowsky, MD

## 2013-02-01 NOTE — Anesthesia Preprocedure Evaluation (Signed)
Anesthesia Evaluation  Patient identified by MRN, date of birth, ID band Patient awake    Reviewed: Allergy & Precautions, H&P , NPO status , Patient's Chart, lab work & pertinent test results  Airway Mallampati: II      Dental   Pulmonary sleep apnea and Continuous Positive Airway Pressure Ventilation ,  breath sounds clear to auscultation        Cardiovascular negative cardio ROS  Rhythm:Regular Rate:Normal     Neuro/Psych Anxiety    GI/Hepatic negative GI ROS, Neg liver ROS,   Endo/Other  negative endocrine ROS  Renal/GU negative Renal ROS     Musculoskeletal   Abdominal   Peds  Hematology   Anesthesia Other Findings   Reproductive/Obstetrics                           Anesthesia Physical Anesthesia Plan  ASA: III  Anesthesia Plan: General   Post-op Pain Management:    Induction: Intravenous  Airway Management Planned: LMA  Additional Equipment:   Intra-op Plan:   Post-operative Plan: Extubation in OR  Informed Consent:   Dental advisory given  Plan Discussed with: Anesthesiologist, CRNA and Surgeon  Anesthesia Plan Comments:         Anesthesia Quick Evaluation

## 2013-02-01 NOTE — Anesthesia Postprocedure Evaluation (Signed)
  Anesthesia Post-op Note  Patient: Russell Hayden  Procedure(s) Performed: Procedure(s) (LRB) with comments: ARTHROSCOPY KNEE (Left)  Patient Location: PACU  Anesthesia Type:General  Level of Consciousness: awake  Airway and Oxygen Therapy: Patient Spontanous Breathing  Post-op Pain: mild  Post-op Assessment: Post-op Vital signs reviewed  Post-op Vital Signs: Reviewed  Complications: No apparent anesthesia complications

## 2013-02-01 NOTE — Anesthesia Procedure Notes (Signed)
Procedure Name: LMA Insertion Date/Time: 02/01/2013 11:45 AM Performed by: Verlan Friends Pre-anesthesia Checklist: Patient identified, Emergency Drugs available, Suction available and Patient being monitored Patient Re-evaluated:Patient Re-evaluated prior to inductionOxygen Delivery Method: Circle System Utilized Preoxygenation: Pre-oxygenation with 100% oxygen Intubation Type: IV induction Ventilation: Mask ventilation without difficulty LMA: LMA inserted LMA Size: 5.0 Number of attempts: 1 Placement Confirmation: positive ETCO2 Tube secured with: Tape Dental Injury: Teeth and Oropharynx as per pre-operative assessment

## 2013-02-01 NOTE — Transfer of Care (Signed)
Immediate Anesthesia Transfer of Care Note  Patient: Russell Hayden  Procedure(s) Performed: Procedure(s) (LRB) with comments: ARTHROSCOPY KNEE (Left)  Patient Location: PACU  Anesthesia Type:General  Level of Consciousness: sedated and unresponsive  Airway & Oxygen Therapy: Patient Spontanous Breathing and Patient connected to face mask oxygen  Post-op Assessment: Report given to PACU RN and Post -op Vital signs reviewed and stable  Post vital signs: Reviewed and stable  Complications: No apparent anesthesia complications

## 2013-02-01 NOTE — Interval H&P Note (Signed)
History and Physical Interval Note:  02/01/2013 11:40 AM  Russell Hayden  has presented today for surgery, with the diagnosis of Left Medial Meniscal Tear  The various methods of treatment have been discussed with the patient and family. After consideration of risks, benefits and other options for treatment, the patient has consented to  Procedure(s) (LRB) with comments: ARTHROSCOPY KNEE (Left) as a surgical intervention .  The patient's history has been reviewed, patient examined, no change in status, stable for surgery.  I have reviewed the patient's chart and labs.  Questions were answered to the patient's satisfaction.     Kellina Dreese J   

## 2013-02-01 NOTE — Interval H&P Note (Signed)
History and Physical Interval Note:  02/01/2013 11:37 AM  Russell Hayden  has presented today for surgery, with the diagnosis of Left Medial Meniscal Tear  The various methods of treatment have been discussed with the patient and family. After consideration of risks, benefits and other options for treatment, the patient has consented to  Procedure(s) (LRB) with comments: ARTHROSCOPY KNEE (Left) as a surgical intervention .  The patient's history has been reviewed, patient examined, no change in status, stable for surgery.  I have reviewed the patient's chart and labs.  Questions were answered to the patient's satisfaction.     Nestor Lewandowsky

## 2013-02-02 ENCOUNTER — Encounter (HOSPITAL_BASED_OUTPATIENT_CLINIC_OR_DEPARTMENT_OTHER): Payer: Self-pay | Admitting: Orthopedic Surgery

## 2013-03-02 ENCOUNTER — Ambulatory Visit (INDEPENDENT_AMBULATORY_CARE_PROVIDER_SITE_OTHER): Payer: BC Managed Care – PPO | Admitting: Family Medicine

## 2013-03-02 VITALS — BP 112/70 | HR 68 | Temp 98.4°F | Resp 16 | Ht 69.0 in | Wt 206.0 lb

## 2013-03-02 DIAGNOSIS — J069 Acute upper respiratory infection, unspecified: Secondary | ICD-10-CM

## 2013-03-02 DIAGNOSIS — J019 Acute sinusitis, unspecified: Secondary | ICD-10-CM

## 2013-03-02 MED ORDER — FLUTICASONE PROPIONATE 50 MCG/ACT NA SUSP: NASAL | Status: DC

## 2013-03-02 MED ORDER — AZITHROMYCIN 250 MG PO TABS: ORAL_TABLET | ORAL | Status: DC

## 2013-03-02 NOTE — Patient Instructions (Addendum)
Drink plenty of fluids  Use the medications as directed  Return if worse 

## 2013-03-02 NOTE — Progress Notes (Signed)
Subjective: 52 year old man with history of getting a sinus infection about once a year. For about a 10 days he's had a respiratory tract problems with congestion. He has facial discomfort. Has been blowing out a lot of mucus. Not coughing excessively. He does not smoke. 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 Wife and kids has not been sick.  Objective: Doesn't look like he is feeling quite is best. TMs are normal. Minimal sinus tenderness. Nose congested. Throat clear. Neck supple without significant nodes. Chest is clear to auscultation. Heart regular without murmurs.  Assessment: URI/sinusitis  Plan: It's been going on long enough to warrant treatment. See orders.

## 2013-12-28 ENCOUNTER — Telehealth: Payer: Self-pay | Admitting: Radiology

## 2013-12-28 ENCOUNTER — Ambulatory Visit: Payer: BC Managed Care – PPO

## 2013-12-28 ENCOUNTER — Ambulatory Visit (INDEPENDENT_AMBULATORY_CARE_PROVIDER_SITE_OTHER): Payer: BC Managed Care – PPO | Admitting: Family Medicine

## 2013-12-28 VITALS — BP 118/74 | HR 78 | Temp 98.0°F | Resp 16 | Ht 69.5 in | Wt 211.0 lb

## 2013-12-28 DIAGNOSIS — R071 Chest pain on breathing: Secondary | ICD-10-CM

## 2013-12-28 DIAGNOSIS — M79609 Pain in unspecified limb: Secondary | ICD-10-CM

## 2013-12-28 DIAGNOSIS — R0789 Other chest pain: Secondary | ICD-10-CM

## 2013-12-28 DIAGNOSIS — M79601 Pain in right arm: Secondary | ICD-10-CM

## 2013-12-28 LAB — POCT CBC
GRANULOCYTE PERCENT: 60.5 % (ref 37–80)
HEMATOCRIT: 48.6 % (ref 43.5–53.7)
Hemoglobin: 15.2 g/dL (ref 14.1–18.1)
Lymph, poc: 2.2 (ref 0.6–3.4)
MCH, POC: 30.8 pg (ref 27–31.2)
MCHC: 31.3 g/dL — AB (ref 31.8–35.4)
MCV: 98.4 fL — AB (ref 80–97)
MID (cbc): 0.6 (ref 0–0.9)
MPV: 8.8 fL (ref 0–99.8)
POC Granulocyte: 4.3 (ref 2–6.9)
POC LYMPH %: 31.5 % (ref 10–50)
POC MID %: 8 %M (ref 0–12)
Platelet Count, POC: 192 10*3/uL (ref 142–424)
RBC: 4.94 M/uL (ref 4.69–6.13)
RDW, POC: 14.2 %
WBC: 7.1 10*3/uL (ref 4.6–10.2)

## 2013-12-28 LAB — D-DIMER, QUANTITATIVE (NOT AT ARMC): D DIMER QUANT: 0.33 ug{FEU}/mL (ref 0.00–0.48)

## 2013-12-28 LAB — POCT SEDIMENTATION RATE: POCT SED RATE: 10 mm/hr (ref 0–22)

## 2013-12-28 MED ORDER — CYCLOBENZAPRINE HCL 5 MG PO TABS: 5.0000 mg | ORAL_TABLET | Freq: Three times a day (TID) | ORAL | Status: DC | PRN

## 2013-12-28 NOTE — Telephone Encounter (Signed)
Patients d dimer normal 0.33 to you for review.

## 2013-12-28 NOTE — Progress Notes (Signed)
Subjective:    Patient ID: Russell Hayden, male    DOB: 1961-08-11, 53 y.o.   MRN: [id number]  HPI Russell Hayden is a 53 y.o. male  C/o severe chest pains, since yesterday afternoon - around 5 pm - center of chest, slight mvmt to upper right. Feels in muscle,  1-2 at rest, 9/10 with movement - "lightning pain" with mvmt. Any mvmt hurts chest. Feels like needs to cough, few coughs, but would hurt to cough. Slight nausea/upset stomach with pain. Feels like hurts to take deep breathe, but not short of breath. No diaphoresis. No palpitations.Feels like arms are weak? - both sides, feels this since yesterday, hurts to move them/stiff, and on repeat questioning - less weak then pain to move them.  Some neck pain - only mild - 1-2/10.  Congestion and coughing last week - resolved. Pain feels like it moves all the way to the back. No known personal or FH of aneurysm. Does have some aching into R arm, but does feel like sore could be form using wheelbarrow.   No recent prolonged car travel or air travel, no recent new calf pain or swelling, no recent surgery. No hx of DVT.   No personal hx of cardiac disease, possible FH of heart disease on maternal side. History of hyperlipidemia. Nonsmoker, no hx of HTN.   Had been using a wheelbarrow yesterday, but NKI, and not more exertion than usual (works out regularly). Pain not noted until way after using wheelbarrow.    Hx low back surgery/LBP. Had leftover hydrocodone, took 2 last night - min relief, meperdine - no relief.  Did take voltaren once this morning..    Patient Active Problem List   Diagnosis Date Noted  . OSA (obstructive sleep apnea) 03/19/2011   Past Medical History  Diagnosis Date  . Hyperlipidemia   . IBS (irritable bowel syndrome)   . Anxiety   . Depression   . Chronic low back pain   . Allergic rhinitis   . Idiopathic thrombocytopenia   . OSA (obstructive sleep apnea)     does use a cpap   Past Surgical History  Procedure  Laterality Date  . Laminectomy      approx 2000  . Nasal sinus surgery      approx late 1990s.  deviated septum  . Spine surgery    . Tonsillectomy    . Colonoscopy    . Knee arthroscopy  02/01/2013    Procedure: ARTHROSCOPY KNEE;  Surgeon: Kerin Salen, MD;  Location: [facility];  Service: Orthopedics;  Laterality: Left;   Allergies  Allergen Reactions  . Penicillins     Itching and rash   Prior to Admission medications   Medication Sig Start Date End Date Taking? Authorizing Provider  Armodafinil (NUVIGIL) 150 MG tablet Take 150 mg by mouth daily.     Yes Historical Provider, MD  diclofenac (VOLTAREN) 75 MG EC tablet Take 75 mg by mouth 2 (two) times daily.   Yes Historical Provider, MD  fish oil-omega-3 fatty acids 1000 MG capsule Take 2 g by mouth daily.    Yes Historical Provider, MD  fluticasone (FLONASE) 50 MCG/ACT nasal spray He is 2 sprays each nostril twice daily for 3 days, then once daily 03/02/13  Yes Posey Boyer, MD  rosuvastatin (CRESTOR) 20 MG tablet Take 20 mg by mouth daily.     Yes Historical Provider, MD  sertraline (ZOLOFT) 100 MG tablet Take 100 mg by  mouth daily.     Yes Historical Provider, MD   History   Social History  . Marital Status: Married    Spouse Name: pt is married.    Number of Children: 2  . Years of Education: N/A   Occupational History  . 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 Smoking status: Never Smoker   . Smokeless tobacco: Not on file  . Alcohol Use: Yes     Comment: occ  . Drug Use: No  . Sexual Activity: Not on file   Other Topics Concern  . Not on file   Social History Narrative   Pt has 2 sons.    Born in [place].             Review of Systems  Constitutional: Negative for fever, chills, diaphoresis and activity change.  Respiratory: Positive for chest tightness. Negative for cough, shortness of breath and wheezing.   Cardiovascular:  Positive for chest pain.  Gastrointestinal: Positive for nausea. Negative for vomiting.  Musculoskeletal: Positive for arthralgias, myalgias and neck pain (minimal). Negative for gait problem and joint swelling.  Skin: Negative for rash and wound.  Neurological: Negative for speech difficulty and weakness.       Objective:   Physical Exam  Vitals reviewed. Constitutional: He is oriented to person, place, and time. He appears well-developed and well-nourished.  HENT:  Head: Normocephalic and atraumatic.  Eyes: EOM are normal. Pupils are equal, round, and reactive to light.  Neck: No JVD present. Carotid bruit is not present.  Cardiovascular: Normal rate, regular rhythm, normal heart sounds and normal pulses.   No extrasystoles are present. PMI is not displaced.  Exam reveals no friction rub.   No murmur heard. Pulmonary/Chest: Effort normal and breath sounds normal. No respiratory distress. He has no wheezes. He has no rales. He exhibits tenderness. He exhibits no deformity and no swelling.    Musculoskeletal: He exhibits no edema.  Calves nontender, negative homan's, no LE edema.   Neurological: He is alert and oriented to person, place, and time.  Equal and full UE strength, equal grip strength.   Skin: Skin is warm and dry.  Psychiatric: He has a normal mood and affect.   Filed Vitals:   12/28/13 1007 12/28/13 1208  BP: 118/74   Pulse: 78   Temp: 98 F (36.7 C)   TempSrc: Oral   Resp: 16   Height: 5' 9.5" (1.765 m)   Weight: 211 lb (95.709 kg)   SpO2: 95% 97%   Results for orders placed in visit on 12/28/13  POCT CBC      Result Value Range   WBC 7.1  4.6 - 10.2 K/uL   Lymph, poc 2.2  0.6 - 3.4   POC LYMPH PERCENT 31.5  10 - 50 %L   MID (cbc) 0.6  0 - 0.9   POC MID % 8.0  0 - 12 %M   POC Granulocyte 4.3  2 - 6.9   Granulocyte percent 60.5  37 - 80 %G   RBC 4.94  4.69 - 6.13 M/uL   Hemoglobin 15.2  14.1 - 18.1 g/dL   HCT, POC 48.6  43.5 - 53.7 %   MCV 98.4 (*) 80  - 97 fL   MCH, POC 30.8  27 - 31.2 pg   MCHC 31.3 (*) 31.8 - 35.4 g/dL   RDW, POC 14.2     Platelet Count, POC 192  142 - 424 K/uL   MPV 8.8  0 - 99.8 fL   EKG: SR, no acute findings.   [facility] reading (PRIMARY) by  Dr. Carlota Raspberry: CXR: no acute findings.    12:58 PM: discussed results and pending tests, ddx most likely MSK/costochondritis, but cannot exclude cardiac, pericarditis, PE or less likely aneurysm and recommended ER evaluation. He declined ER eval at present - would like to try m relaxer first and if not improving in few hours ill go to ER for eval, or 911 if needed. Risks of delay in eval discussed if cardiac cause, but reproducible nature on exam suggests MSK source.     Assessment & Plan:   Russell Hayden is a 53 y.o. male Chest wall pain - Plan: POCT CBC, POCT SEDIMENTATION RATE, DG Chest 2 View, D-dimer, quantitative, EKG 12-Lead, cyclobenzaprine (FLEXERIL) 5 MG tablet, CANCELED: D-dimer, quantitative  Arm pain, right - Plan: cyclobenzaprine (FLEXERIL) 5 MG tablet  Reproducible nature of chest wall pain, and pain with mvmt probable costochondritis/muscular source. Differential and ER eval discussed as above and discussed with wife in room at end of visit, but would like to try sx care for now and if not improving into tonight, plan on ER eval, otherwise recheck tomorrow. D dimer pending. Noted ESR wnl, unlikely pericarditis.  ER/911 chest pain precautions reviewed.   Lab Results  Component Value Date   POCTSEDRATE 10 12/28/2013     Meds ordered this encounter  Medications  . cyclobenzaprine (FLEXERIL) 5 MG tablet    Sig: Take 1-2 tablets (5-10 mg total) by mouth 3 (three) times daily as needed for muscle spasms.    Dispense:  15 tablet    Refill:  0   Patient Instructions  You can try ice or heat to area, flexeril as discussed, and continue diclofenac twice per day, but if pain is not letting up into tonight, be evaluated in the emergency room. Return to the clinic or go  to the nearest emergency room if any of your symptoms worsen or new symptoms occur. Chest Pain (Nonspecific) It is often hard to give a specific diagnosis for the cause of chest pain. There is always a chance that your pain could be related to something serious, such as a heart attack or a blood clot in the lungs. You need to follow up with your caregiver for further evaluation. CAUSES   Heartburn.  Pneumonia or bronchitis.  Anxiety or stress.  Inflammation around your heart (pericarditis) or lung (pleuritis or pleurisy).  A blood clot in the lung.  A collapsed lung (pneumothorax). It can develop suddenly on its own (spontaneous pneumothorax) or from injury (trauma) to the chest.  Shingles infection (herpes zoster virus). The chest wall is composed of bones, muscles, and cartilage. Any of these can be the source of the pain.  The bones can be bruised by injury.  The muscles or cartilage can be strained by coughing or overwork.  The cartilage can be affected by inflammation and become sore (costochondritis). DIAGNOSIS  Lab tests or other studies, such as X-rays, electrocardiography, stress testing, or cardiac imaging, may be needed to find the cause of your pain.  TREATMENT   Treatment depends on what may be causing your chest pain. Treatment may include:  Acid blockers for heartburn.  Anti-inflammatory medicine.  Pain medicine for inflammatory conditions.  Antibiotics if an infection is present.  You may be advised to change lifestyle habits. This includes stopping smoking and avoiding alcohol, caffeine, and chocolate.  You may be advised to keep your head raised (elevated) when sleeping. This reduces the chance of acid going backward from your stomach into your esophagus.  Most of the time, nonspecific chest pain will improve within 2 to 3 days with rest and mild pain medicine. HOME CARE INSTRUCTIONS   If antibiotics were prescribed, take your antibiotics as directed.  Finish them even if you start to feel better.  For the next few days, avoid physical activities that bring on chest pain. Continue physical activities as directed.  Do not smoke.  Avoid drinking alcohol.  Only take over-the-counter or prescription medicine for pain, discomfort, or fever as directed by your caregiver.  Follow your caregiver's suggestions for further testing if your chest pain does not go away.  Keep any follow-up appointments you made. If you do not go to an appointment, you could develop lasting (chronic) problems with pain. If there is any problem keeping an appointment, you must call to reschedule. SEEK MEDICAL CARE IF:   You think you are having problems from the medicine you are taking. Read your medicine instructions carefully.  Your chest pain does not go away, even after treatment.  You develop a rash with blisters on your chest. SEEK IMMEDIATE MEDICAL CARE IF:   You have increased chest pain or pain that spreads to your arm, neck, jaw, back, or abdomen.  You develop shortness of breath, an increasing cough, or you are coughing up blood.  You have severe back or abdominal pain, feel nauseous, or vomit.  You develop severe weakness, fainting, or chills.  You have a fever. THIS IS AN EMERGENCY. Do not wait to see if the pain will go away. Get medical help at once. Call your local emergency services (911 in [place] Do not drive yourself to the hospital. MAKE SURE YOU:   Understand these instructions.  Will watch your condition.  Will get help right away if you are not doing well or get worse. Document Released: 09/23/2005 Document Revised: 03/07/2012 Document Reviewed: 07/19/2008 [facility] Patient Information 2014 [place].

## 2013-12-28 NOTE — Patient Instructions (Addendum)
You can try ice or heat to area, flexeril as discussed, and continue diclofenac twice per day, but if pain is not letting up into tonight, be evaluated in the emergency room. Return to the clinic or go to the nearest emergency room if any of your symptoms worsen or new symptoms occur. Chest Pain (Nonspecific) It is often hard to give a specific diagnosis for the cause of chest pain. There is always a chance that your pain could be related to something serious, such as a heart attack or a blood clot in the lungs. You need to follow up with your caregiver for further evaluation. CAUSES   Heartburn.  Pneumonia or bronchitis.  Anxiety or stress.  Inflammation around your heart (pericarditis) or lung (pleuritis or pleurisy).  A blood clot in the lung.  A collapsed lung (pneumothorax). It can develop suddenly on its own (spontaneous pneumothorax) or from injury (trauma) to the chest.  Shingles infection (herpes zoster virus). The chest wall is composed of bones, muscles, and cartilage. Any of these can be the source of the pain.  The bones can be bruised by injury.  The muscles or cartilage can be strained by coughing or overwork.  The cartilage can be affected by inflammation and become sore (costochondritis). DIAGNOSIS  Lab tests or other studies, such as X-rays, electrocardiography, stress testing, or cardiac imaging, may be needed to find the cause of your pain.  TREATMENT   Treatment depends on what may be causing your chest pain. Treatment may include:  Acid blockers for heartburn.  Anti-inflammatory medicine.  Pain medicine for inflammatory conditions.  Antibiotics if an infection is present.  You may be advised to change lifestyle habits. This includes stopping smoking and avoiding alcohol, caffeine, and chocolate.  You may be advised to keep your head raised (elevated) when sleeping. This reduces the chance of acid going backward from your stomach into your  esophagus.  Most of the time, nonspecific chest pain will improve within 2 to 3 days with rest and mild pain medicine. HOME CARE INSTRUCTIONS   If antibiotics were prescribed, take your antibiotics as directed. Finish them even if you start to feel better.  For the next few days, avoid physical activities that bring on chest pain. Continue physical activities as directed.  Do not smoke.  Avoid drinking alcohol.  Only take over-the-counter or prescription medicine for pain, discomfort, or fever as directed by your caregiver.  Follow your caregiver's suggestions for further testing if your chest pain does not go away.  Keep any follow-up appointments you made. If you do not go to an appointment, you could develop lasting (chronic) problems with pain. If there is any problem keeping an appointment, you must call to reschedule. SEEK MEDICAL CARE IF:   You think you are having problems from the medicine you are taking. Read your medicine instructions carefully.  Your chest pain does not go away, even after treatment.  You develop a rash with blisters on your chest. SEEK IMMEDIATE MEDICAL CARE IF:   You have increased chest pain or pain that spreads to your arm, neck, jaw, back, or abdomen.  You develop shortness of breath, an increasing cough, or you are coughing up blood.  You have severe back or abdominal pain, feel nauseous, or vomit.  You develop severe weakness, fainting, or chills.  You have a fever. THIS IS AN EMERGENCY. Do not wait to see if the pain will go away. Get medical help at once. Call your local emergency  services (911 in [place] Do not drive yourself to the hospital. MAKE SURE YOU:   Understand these instructions.  Will watch your condition.  Will get help right away if you are not doing well or get worse. Document Released: 09/23/2005 Document Revised: 03/07/2012 Document Reviewed: 07/19/2008 [facility] Patient Information 2014 [place], [place].

## 2013-12-28 NOTE — Telephone Encounter (Signed)
Left vm re: negative/normal D dimer. Plan to follow up tomorrow as planned.

## 2014-04-03 ENCOUNTER — Other Ambulatory Visit: Payer: Self-pay | Admitting: Orthopedic Surgery

## 2014-04-03 ENCOUNTER — Encounter (HOSPITAL_BASED_OUTPATIENT_CLINIC_OR_DEPARTMENT_OTHER): Payer: Self-pay | Admitting: *Deleted

## 2014-04-03 NOTE — Progress Notes (Signed)
Pt had knee done here 2/14-coming for lt knee and lt shoulder-to bring all meds and cpap and overnight bag in case he has to stay

## 2014-04-06 NOTE — H&P (Signed)
Russell Hayden is an 53 y.o. male.   Chief Complaint: Left shoulder and left knee pain  HPI: Patient returns today with MRI of his left shoulder showing supraspinatus cuff irritation and possible partial-thickness tear as well as some degenerative tearing of the posterior aspect of the labrum and a.c. joint arthritis.  His shoulder pain continues to wake him up at night is worse with overhead activities and does not bother him when his arms are down at his side.  Anesthetic and cortisone injection did provide temporary relief about 50% from the cortisone.  He does recall what the anesthetic itself did.  He continues have pain to the posterior medial aspect of his left knee were an MRI scan also showed a recurrent complex tear the posterior horn of the medial meniscus.  Past Medical History  Diagnosis Date  . Hyperlipidemia   . IBS (irritable bowel syndrome)   . Anxiety   . Depression   . Chronic low back pain   . Allergic rhinitis   . Idiopathic thrombocytopenia   . OSA (obstructive sleep apnea)     does use a cpap    Past Surgical History  Procedure Laterality Date  . Laminectomy      approx 2000  . Nasal sinus surgery      approx late 1990s.  deviated septum  . Spine surgery    . Tonsillectomy    . Colonoscopy    . Knee arthroscopy  02/01/2013    Procedure: ARTHROSCOPY KNEE;  Surgeon: Nestor Lewandowsky, MD;  Location: [facility];  Service: Orthopedics;  Laterality: Left;    Family History  Problem Relation Age of Onset  . Hyperlipidemia Mother   . Hypertension Mother   . Celiac disease Mother   . Hypertension Brother   . Celiac disease Brother   . Leukemia Father   . Skin cancer Father   . Cancer Paternal Grandmother   . Asthma Mother   . Allergies Mother    Social History:  reports that he has never smoked. He does not have any smokeless tobacco history on file. He reports that he drinks alcohol. He reports that he does not use illicit drugs.  Allergies:   Allergies  Allergen Reactions  . Penicillins     Itching and rash    No prescriptions prior to admission    No results found for this or any previous visit (from the past 48 hour(s)). No results found.  Review of Systems  Constitutional: Negative.   HENT: Positive for hearing loss.   Eyes: Positive for blurred vision.       Weras glasses  Respiratory: Negative.        Sleep apnea  Cardiovascular: Negative.   Gastrointestinal: Negative.   Genitourinary: Negative.   Musculoskeletal: Positive for joint pain.  Skin: Negative.   Neurological: Negative.   Endo/Heme/Allergies: Negative.   Psychiatric/Behavioral: Negative.     Height 5' 9.5" (1.765 m), weight 95.255 kg (210 lb). Physical Exam  Constitutional: He is oriented to person, place, and time. He appears well-developed and well-nourished.  HENT:  Head: Normocephalic and atraumatic.  Eyes: Pupils are equal, round, and reactive to light.  Neck: Normal range of motion. Neck supple.  Cardiovascular: Intact distal pulses.   Respiratory: Effort normal.  Musculoskeletal: He exhibits tenderness.  Impingement signs are positive the left shoulder, rotator cuff power is intact.  Cross chest adduction test also causes one to 2+ pain.  He is 2+ tenderness over the a.c.  joint itself.    Neurological: He is alert and oriented to person, place, and time.  Skin: Skin is warm and dry.  Psychiatric: He has a normal mood and affect. His behavior is normal. Judgment and thought content normal.     The MRI scan is reviewed and is as dictated.  He is tender along the medial side of the left knee, trace effusion, collateral ligaments are stable.  Assessment/Plan Assess:  1. 4 month history of left shoulder impingement syndrome refractory to anti-inflammatory medicines, exercises, temporary relief with cortisone injection, MRI scan shows rotator cuff tear irritation without overt tear. 2.  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  Plan: Options were discussed at length. After he had his knee scoped last year he was able ambulate without a crutch or cane.  He would like to proceed with left shoulder arthroscopic bursectomy, acromioplasty, distal clavicle excision and debridement of labral tear followed by left knee arthroscopic removal of meniscal tears.  If the rotator cuff is actually torn will repair it and hold off on a knee scope.  I will see him back at the time of surgical intervention.  Allena Katzric K Marks Scalera 04/06/2014, 3:04 PM

## 2014-04-09 ENCOUNTER — Encounter (HOSPITAL_BASED_OUTPATIENT_CLINIC_OR_DEPARTMENT_OTHER): Payer: BC Managed Care – PPO | Admitting: Certified Registered"

## 2014-04-09 ENCOUNTER — Encounter (HOSPITAL_BASED_OUTPATIENT_CLINIC_OR_DEPARTMENT_OTHER): Payer: Self-pay | Admitting: Anesthesiology

## 2014-04-09 ENCOUNTER — Ambulatory Visit (HOSPITAL_BASED_OUTPATIENT_CLINIC_OR_DEPARTMENT_OTHER): Payer: BC Managed Care – PPO | Admitting: Certified Registered"

## 2014-04-09 ENCOUNTER — Encounter (HOSPITAL_BASED_OUTPATIENT_CLINIC_OR_DEPARTMENT_OTHER)
Admission: RE | Disposition: A | Discharge status: Home / Self Care | Payer: Self-pay | Source: Ambulatory Visit | Attending: Orthopedic Surgery

## 2014-04-09 ENCOUNTER — Ambulatory Visit (HOSPITAL_BASED_OUTPATIENT_CLINIC_OR_DEPARTMENT_OTHER)
Admission: RE | Admit: 2014-04-09 | Discharge: 2014-04-09 | Disposition: A | Discharge status: Home / Self Care | Payer: BC Managed Care – PPO | Source: Ambulatory Visit | Attending: Orthopedic Surgery | Admitting: Orthopedic Surgery

## 2014-04-09 DIAGNOSIS — M7542 Impingement syndrome of left shoulder: Secondary | ICD-10-CM | POA: Diagnosis present

## 2014-04-09 DIAGNOSIS — M75100 Unspecified rotator cuff tear or rupture of unspecified shoulder, not specified as traumatic: Secondary | ICD-10-CM

## 2014-04-09 DIAGNOSIS — Z880 Allergy status to penicillin: Secondary | ICD-10-CM | POA: Insufficient documentation

## 2014-04-09 DIAGNOSIS — M76899 Other specified enthesopathies of unspecified lower limb, excluding foot: Secondary | ICD-10-CM | POA: Insufficient documentation

## 2014-04-09 DIAGNOSIS — E785 Hyperlipidemia, unspecified: Secondary | ICD-10-CM | POA: Insufficient documentation

## 2014-04-09 DIAGNOSIS — M19019 Primary osteoarthritis, unspecified shoulder: Secondary | ICD-10-CM | POA: Insufficient documentation

## 2014-04-09 DIAGNOSIS — M23302 Other meniscus derangements, unspecified lateral meniscus, unspecified knee: Secondary | ICD-10-CM | POA: Insufficient documentation

## 2014-04-09 DIAGNOSIS — M2240 Chondromalacia patellae, unspecified knee: Secondary | ICD-10-CM | POA: Insufficient documentation

## 2014-04-09 DIAGNOSIS — G4733 Obstructive sleep apnea (adult) (pediatric): Secondary | ICD-10-CM | POA: Insufficient documentation

## 2014-04-09 DIAGNOSIS — M7580 Other shoulder lesions, unspecified shoulder: Principal | ICD-10-CM

## 2014-04-09 DIAGNOSIS — M23329 Other meniscus derangements, posterior horn of medial meniscus, unspecified knee: Secondary | ICD-10-CM | POA: Insufficient documentation

## 2014-04-09 DIAGNOSIS — M25819 Other specified joint disorders, unspecified shoulder: Secondary | ICD-10-CM | POA: Insufficient documentation

## 2014-04-09 DIAGNOSIS — D696 Thrombocytopenia, unspecified: Secondary | ICD-10-CM | POA: Insufficient documentation

## 2014-04-09 DIAGNOSIS — S83249A Other tear of medial meniscus, current injury, unspecified knee, initial encounter: Secondary | ICD-10-CM

## 2014-04-09 HISTORY — PX: SHOULDER ARTHROSCOPY: SHX128

## 2014-04-09 HISTORY — PX: KNEE ARTHROSCOPY: SHX127

## 2014-04-09 SURGERY — ARTHROSCOPY, SHOULDER
Anesthesia: Regional | Site: Shoulder | Laterality: Left

## 2014-04-09 MED ORDER — LACTATED RINGERS IV SOLN
INTRAVENOUS | Status: DC
  Administered 2014-04-09 (×2): via INTRAVENOUS

## 2014-04-09 MED ORDER — DOCUSATE SODIUM 100 MG PO CAPS: 100.0000 mg | ORAL_CAPSULE | Freq: Two times a day (BID) | ORAL | Status: DC

## 2014-04-09 MED ORDER — HYDROCODONE-ACETAMINOPHEN 5-325 MG PO TABS: 1.0000 | ORAL_TABLET | ORAL | Status: DC | PRN

## 2014-04-09 MED ORDER — VANCOMYCIN HCL IN DEXTROSE 1-5 GM/200ML-% IV SOLN
1000.0000 mg | INTRAVENOUS | Status: AC
  Administered 2014-04-09: 1000 mg via INTRAVENOUS

## 2014-04-09 MED ORDER — EPINEPHRINE HCL 1 MG/ML IJ SOLN
INTRAMUSCULAR | Status: DC | PRN
  Administered 2014-04-09: 1 mg

## 2014-04-09 MED ORDER — SUCCINYLCHOLINE CHLORIDE 20 MG/ML IJ SOLN
INTRAMUSCULAR | Status: DC | PRN
  Administered 2014-04-09: 100 mg via INTRAVENOUS

## 2014-04-09 MED ORDER — MIDAZOLAM HCL 2 MG/2ML IJ SOLN
1.0000 mg | INTRAMUSCULAR | Status: DC | PRN
  Administered 2014-04-09: 2 mg via INTRAVENOUS

## 2014-04-09 MED ORDER — OXYCODONE HCL 5 MG PO TABS
ORAL_TABLET | ORAL | Status: AC
  Filled 2014-04-09: qty 1

## 2014-04-09 MED ORDER — HYDROMORPHONE HCL PF 1 MG/ML IJ SOLN
INTRAMUSCULAR | Status: AC
  Filled 2014-04-09: qty 1

## 2014-04-09 MED ORDER — DEXTROSE 5 % IV SOLN: 500.0000 mg | Freq: Four times a day (QID) | INTRAVENOUS | Status: DC | PRN

## 2014-04-09 MED ORDER — HYDROMORPHONE HCL PF 1 MG/ML IJ SOLN
0.2500 mg | INTRAMUSCULAR | Status: DC | PRN
  Administered 2014-04-09 (×2): 0.5 mg via INTRAVENOUS

## 2014-04-09 MED ORDER — DEXTROSE-NACL 5-0.45 % IV SOLN: INTRAVENOUS | Status: DC

## 2014-04-09 MED ORDER — VANCOMYCIN HCL IN DEXTROSE 1-5 GM/200ML-% IV SOLN
INTRAVENOUS | Status: AC
  Filled 2014-04-09: qty 200

## 2014-04-09 MED ORDER — SODIUM CHLORIDE 0.9 % IR SOLN
Status: DC | PRN
  Administered 2014-04-09: 9000 mL

## 2014-04-09 MED ORDER — CYCLOBENZAPRINE HCL 5 MG PO TABS: 5.0000 mg | ORAL_TABLET | Freq: Three times a day (TID) | ORAL | Status: DC | PRN

## 2014-04-09 MED ORDER — FENTANYL CITRATE 0.05 MG/ML IJ SOLN
50.0000 ug | INTRAMUSCULAR | Status: DC | PRN
  Administered 2014-04-09: 100 ug via INTRAVENOUS

## 2014-04-09 MED ORDER — ATORVASTATIN CALCIUM 40 MG PO TABS: 40.0000 mg | ORAL_TABLET | Freq: Every day | ORAL | Status: DC

## 2014-04-09 MED ORDER — OXYCODONE-ACETAMINOPHEN 5-325 MG PO TABS: 1.0000 | ORAL_TABLET | ORAL | Status: DC | PRN

## 2014-04-09 MED ORDER — FENTANYL CITRATE 0.05 MG/ML IJ SOLN
INTRAMUSCULAR | Status: AC
  Filled 2014-04-09: qty 2

## 2014-04-09 MED ORDER — DEXAMETHASONE SODIUM PHOSPHATE 4 MG/ML IJ SOLN
INTRAMUSCULAR | Status: DC | PRN
  Administered 2014-04-09: 10 mg via INTRAVENOUS

## 2014-04-09 MED ORDER — SERTRALINE HCL 100 MG PO TABS: 100.0000 mg | ORAL_TABLET | Freq: Every day | ORAL | Status: DC

## 2014-04-09 MED ORDER — OXYCODONE-ACETAMINOPHEN 5-325 MG PO TABS: 1.0000 | ORAL_TABLET | Freq: Four times a day (QID) | ORAL | Status: DC | PRN

## 2014-04-09 MED ORDER — FLUTICASONE PROPIONATE 50 MCG/ACT NA SUSP: 2.0000 | Freq: Two times a day (BID) | NASAL | Status: DC

## 2014-04-09 MED ORDER — ONDANSETRON HCL 4 MG PO TABS
4.0000 mg | ORAL_TABLET | Freq: Four times a day (QID) | ORAL | Status: DC | PRN
Start: 2014-04-09 — End: 2014-04-09

## 2014-04-09 MED ORDER — FENTANYL CITRATE 0.05 MG/ML IJ SOLN
INTRAMUSCULAR | Status: DC | PRN
  Administered 2014-04-09: 50 ug via INTRAVENOUS

## 2014-04-09 MED ORDER — CHLORHEXIDINE GLUCONATE 4 % EX LIQD: 60.0000 mL | Freq: Once | CUTANEOUS | Status: DC

## 2014-04-09 MED ORDER — ONDANSETRON HCL 4 MG/2ML IJ SOLN
INTRAMUSCULAR | Status: DC | PRN
  Administered 2014-04-09: 4 mg via INTRAVENOUS

## 2014-04-09 MED ORDER — METHOCARBAMOL 500 MG PO TABS: 500.0000 mg | ORAL_TABLET | Freq: Four times a day (QID) | ORAL | Status: DC | PRN

## 2014-04-09 MED ORDER — ROPIVACAINE HCL 5 MG/ML IJ SOLN
INTRAMUSCULAR | Status: DC | PRN
  Administered 2014-04-09: 25 mL via PERINEURAL

## 2014-04-09 MED ORDER — ARMODAFINIL 150 MG PO TABS
150.0000 mg | ORAL_TABLET | Freq: Every day | ORAL | Status: DC
Start: 2014-04-09 — End: 2014-04-09

## 2014-04-09 MED ORDER — FENTANYL CITRATE 0.05 MG/ML IJ SOLN
INTRAMUSCULAR | Status: AC
  Filled 2014-04-09: qty 6

## 2014-04-09 MED ORDER — GLYCOPYRROLATE 0.2 MG/ML IJ SOLN
INTRAMUSCULAR | Status: DC | PRN
  Administered 2014-04-09: 0.2 mg via INTRAVENOUS

## 2014-04-09 MED ORDER — BUPIVACAINE-EPINEPHRINE PF 0.5-1:200000 % IJ SOLN
INTRAMUSCULAR | Status: AC
  Filled 2014-04-09: qty 30

## 2014-04-09 MED ORDER — OXYCODONE HCL 5 MG PO TABS
5.0000 mg | ORAL_TABLET | Freq: Once | ORAL | Status: AC | PRN
  Administered 2014-04-09: 5 mg via ORAL

## 2014-04-09 MED ORDER — PROPOFOL 10 MG/ML IV BOLUS
INTRAVENOUS | Status: DC | PRN
  Administered 2014-04-09: 150 mg via INTRAVENOUS

## 2014-04-09 MED ORDER — MIDAZOLAM HCL 2 MG/2ML IJ SOLN
INTRAMUSCULAR | Status: AC
  Filled 2014-04-09: qty 2

## 2014-04-09 MED ORDER — ONDANSETRON HCL 4 MG/2ML IJ SOLN: 4.0000 mg | Freq: Four times a day (QID) | INTRAMUSCULAR | Status: DC | PRN

## 2014-04-09 MED ORDER — BUPIVACAINE-EPINEPHRINE 0.5% -1:200000 IJ SOLN
INTRAMUSCULAR | Status: DC | PRN
  Administered 2014-04-09: 20 mL

## 2014-04-09 MED ORDER — OXYCODONE HCL 5 MG/5ML PO SOLN: 5.0000 mg | Freq: Once | ORAL | Status: AC | PRN

## 2014-04-09 SURGICAL SUPPLY — 73 items
BANDAGE ELASTIC 6 VELCRO ST LF (GAUZE/BANDAGES/DRESSINGS) ×4 IMPLANT
BLADE 4.2CUDA (BLADE) IMPLANT
BLADE AVERAGE 25MMX9MM (BLADE)
BLADE AVERAGE 25X9 (BLADE) IMPLANT
BLADE CUTTER GATOR 3.5 (BLADE) IMPLANT
BLADE GREAT WHITE 4.2 (BLADE) ×3 IMPLANT
BLADE GREAT WHITE 4.2MM (BLADE) ×1
BLADE SURG 15 STRL LF DISP TIS (BLADE) IMPLANT
BLADE SURG 15 STRL SS (BLADE)
BUR EGG 3PK/BX (BURR) IMPLANT
BUR VERTEX HOODED 4.5 (BURR) ×4 IMPLANT
CANISTER SUCT 3000ML (MISCELLANEOUS) IMPLANT
CANNULA 5.75X71 LONG (CANNULA) IMPLANT
CANNULA TWIST IN 8.25X7CM (CANNULA) IMPLANT
DECANTER SPIKE VIAL GLASS SM (MISCELLANEOUS) IMPLANT
DRAPE ARTHROSCOPY W/POUCH 114 (DRAPES) ×4 IMPLANT
DRAPE INCISE IOBAN 66X45 STRL (DRAPES) IMPLANT
DRAPE SHOULDER BEACH CHAIR (DRAPES) ×4 IMPLANT
DURAPREP 26ML APPLICATOR (WOUND CARE) ×8 IMPLANT
ELECT MENISCUS 165MM 90D (ELECTRODE) IMPLANT
ELECT REM PT RETURN 9FT ADLT (ELECTROSURGICAL) ×4
ELECTRODE REM PT RTRN 9FT ADLT (ELECTROSURGICAL) ×2 IMPLANT
GAUZE XEROFORM 1X8 LF (GAUZE/BANDAGES/DRESSINGS) ×4 IMPLANT
GLOVE BIO SURGEON STRL SZ7.5 (GLOVE) ×4 IMPLANT
GLOVE BIO SURGEON STRL SZ8.5 (GLOVE) ×4 IMPLANT
GLOVE BIOGEL PI IND STRL 7.0 (GLOVE) ×4 IMPLANT
GLOVE BIOGEL PI IND STRL 8 (GLOVE) ×2 IMPLANT
GLOVE BIOGEL PI IND STRL 9 (GLOVE) ×2 IMPLANT
GLOVE BIOGEL PI INDICATOR 7.0 (GLOVE) ×4
GLOVE BIOGEL PI INDICATOR 8 (GLOVE) ×2
GLOVE BIOGEL PI INDICATOR 9 (GLOVE) ×2
GLOVE ECLIPSE 6.5 STRL STRAW (GLOVE) ×4 IMPLANT
GOWN STRL REUS W/ TWL LRG LVL3 (GOWN DISPOSABLE) ×4 IMPLANT
GOWN STRL REUS W/ TWL XL LVL3 (GOWN DISPOSABLE) ×2 IMPLANT
GOWN STRL REUS W/TWL LRG LVL3 (GOWN DISPOSABLE) ×4
GOWN STRL REUS W/TWL XL LVL3 (GOWN DISPOSABLE) ×2
IV NS IRRIG 3000ML ARTHROMATIC (IV SOLUTION) ×4 IMPLANT
KNEE WRAP E Z 3 GEL PACK (MISCELLANEOUS) ×4 IMPLANT
MANIFOLD NEPTUNE II (INSTRUMENTS) ×4 IMPLANT
NDL SAFETY ECLIPSE 18X1.5 (NEEDLE) ×2 IMPLANT
NDL SUT 6 .5 CRC .975X.05 MAYO (NEEDLE) IMPLANT
NEEDLE HYPO 18GX1.5 SHARP (NEEDLE) ×2
NEEDLE MAYO TAPER (NEEDLE)
NS IRRIG 1000ML POUR BTL (IV SOLUTION) IMPLANT
PACK ARTHROSCOPY DSU (CUSTOM PROCEDURE TRAY) ×8 IMPLANT
PACK BASIN DAY SURGERY FS (CUSTOM PROCEDURE TRAY) ×4 IMPLANT
PAD ALCOHOL SWAB (MISCELLANEOUS) IMPLANT
PASSER SUT SWANSON 36MM LOOP (INSTRUMENTS) IMPLANT
PENCIL BUTTON HOLSTER BLD 10FT (ELECTRODE) IMPLANT
SET ARTHROSCOPY TUBING (MISCELLANEOUS)
SET ARTHROSCOPY TUBING LN (MISCELLANEOUS) IMPLANT
SET IRRIG Y TYPE TUR BLADDER L (SET/KITS/TRAYS/PACK) ×4 IMPLANT
SLEEVE SCD COMPRESS KNEE MED (MISCELLANEOUS) ×4 IMPLANT
SLING ARM IMMOBILIZER LRG (SOFTGOODS) IMPLANT
SLING ARM LRG ADULT FOAM STRAP (SOFTGOODS) ×4 IMPLANT
SLING ARM MED ADULT FOAM STRAP (SOFTGOODS) IMPLANT
SPONGE GAUZE 4X4 12PLY (GAUZE/BANDAGES/DRESSINGS) ×8 IMPLANT
SPONGE LAP 4X18 X RAY DECT (DISPOSABLE) IMPLANT
SUCTION FRAZIER TIP 10 FR DISP (SUCTIONS) IMPLANT
SUT ETHIBOND 2 OS 4 DA (SUTURE) IMPLANT
SUT ETHILON 4 0 PS 2 18 (SUTURE) IMPLANT
SUT MNCRL AB 4-0 PS2 18 (SUTURE) IMPLANT
SUT VIC AB 3-0 PS1 18 (SUTURE)
SUT VIC AB 3-0 PS1 18XBRD (SUTURE) IMPLANT
SYR 3ML 18GX1 1/2 (SYRINGE) IMPLANT
SYR 5ML LL (SYRINGE) ×4 IMPLANT
SYR TB 1ML LL NO SAFETY (SYRINGE) IMPLANT
TAPE PAPER 3X10 WHT MICROPORE (GAUZE/BANDAGES/DRESSINGS) ×4 IMPLANT
TOWEL OR 17X24 6PK STRL BLUE (TOWEL DISPOSABLE) ×4 IMPLANT
TUBE CONNECTING 20'X1/4 (TUBING)
TUBE CONNECTING 20X1/4 (TUBING) IMPLANT
WAND STAR VAC 90 (SURGICAL WAND) ×4 IMPLANT
WATER STERILE IRR 1000ML POUR (IV SOLUTION) ×4 IMPLANT

## 2014-04-09 NOTE — Transfer of Care (Signed)
Immediate Anesthesia Transfer of Care Note  Patient: Russell RedheadMark A Hayden  Procedure(s) Performed: Procedure(s): LEFT ARTHROSCOPY SHOULDER, DISTAL CLAVICLE EXCISION, ACROMIOPLASTY (Left) LEFT ARTHROSCOPY KNEE, CHONDROPLASTY (Left)  Patient Location: PACU  Anesthesia Type:General  Level of Consciousness: awake and sedated  Airway & Oxygen Therapy: Patient Spontanous Breathing and Patient connected to face mask oxygen  Post-op Assessment: Report given to PACU RN and Post -op Vital signs reviewed and stable  Post vital signs: Reviewed and stable  Complications: No apparent anesthesia complications

## 2014-04-09 NOTE — Op Note (Signed)
Preoperative diagnosis: Left shoulder impingement syndrome, a.c. joint arthritis, possible labral tear, possible loose bodies; left knee medial meniscal tear possible loose bodies and chondromalacia   Postoperative diagnosis: Left shoulder impingement syndrome, a.c. 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 Turner Danielsowan M.D.   First assistant: Allena KatzEric K Phillips PA-C, was present for entire procedure, was needed for retraction, operation of arthroscopic equipment, placement of dressing.  Anesthetic: Left shoulder interscalene block plus general endotracheal, local anesthetic half percent Marcaine with epinephrine left knee Estimated blood loss: Minimal   Fluid replacement: 1200 cc of crystalloid.   Indications for procedure: Patient with shoulder impingement syndrome and a.c. joint arthritis who is failed conservative measures with anti-inflammatory medicines, therapy and exercises, but did get temporary relief from a cortisone injection in the subacromial space. Patient also has pain along the medial joint line of his left knee bursitis reproduces pain with a palpable click has lost articular cartilage a couple millimeters to the medial side of the left knee. Because of increasing pain and weakness patient desires elective arthroscopic evaluation with acromioplasty, distal clavicle excision and we will also address any other intra-articular pathology; if the patient is not have rotator cuff tear we'll proceed with left knee arthroscopy and presumed meniscectomy and debridement.. Risks and benefits of surgery were  discussed prior to the procedure and all questions answered.   Description of procedure: Patient was identified by arm band and given preoperative IV antibiotics in the holding area, as well as interscalene block anesthetic.Marland Kitchen. Patient was taken to the operating room where the appropriate anesthetic monitors were attached and general endotracheal anesthesia was induced with the patient in the supine position. Patient was then placed in the beachchair position and the left upper extremity prepped and draped in the usual sterile fashion from the wrist to the hemithorax. We also prepped and draped the left lower trembly in usual sterile fashion and applied a lateral post to the table. A time out procedure was performed. We began the operation by making standard portals 1.5 cm anterior to the acromion, 1.5 cm lateral to the junction of the middle and posterior thirds of the acromion, and 1.5 cm posterior the posterior lateral corner of the acromion process. The inflow with gravity was placed anteriorly, the arthroscope laterally, and a 4.2 great-white sucker shaver posteriorly. The subacromial bursa was resected and we visualized the subacromial spur which was then removed with a 4.5 hooded vortex bur making 2 passes. We then visualized the arthritic a.c. joint and the inferior distal centimeter of the clavicle was resected using a 4.5 hooded vortex bur from the posterior portal. We then brought the burr anteriorly, the scope posteriorly, and the inflow laterally completing the 1 cm distal clavicle excision. Carefully evaluated the external leaflets of the rotator cuff tendon was intact. Moving into the glenohumeral joint the articular and the labral cartilages were visualized and large chondral loose bodies a centimeter by centimeter by 2 mm diameter were identified and removed with a 42 gray-white sucker shaver and a 35 Gator sucker shaver donor site was the humeral head which had moderate arthritis after removal of  the torn cartilage. There is also degenerative tearing of the labrum identified and debrided  with a 35 Gator sucker shaver. This wound from anterior superior to posterior. At this point the shoulder was irrigated out normal saline solution the arthroscopic instruments removed. We covered the wounds with a towel and moved onto the left knee and made standard inferomedial inferolateral peripatellar portals. The arthroscope was inserted through the inferolateral portal and diagnostic arthroscopy revealed grade 3 chondromalacia with large flap tears to the trochlea which were debrided a loose body underneath the patella 4 x 4 millimeters in diameter there was also removed. There is chondromalacia the distal and posterior aspects of the medial femoral condyle grade 2-3 debrided with a 3.5 Gator sucker shaver the medial meniscus had extensive degenerative tearing medially and straight posteriorly and this was likewise debrided and probed. The anterior cruciate ligament a piece are intact on the lateral side the patient had minimal degenerative tearing of the lateral meniscus that was incidentally debrided. The gutters were cleared medially and laterally. After irrigation the arthroscopic instance removed. The both surgery sites a dressing of Xerofoam 4 x 4 dressing sponges, paper tape, and sling applied to the shoulder, Xerofoam 4 x 4's web roll and a six-inch Ace wrap to the knee. The patient was then placed in supine awakened extubated and taken to the recovery without difficulty.

## 2014-04-09 NOTE — Discharge Instructions (Signed)
Regional Anesthesia Blocks ° °1. Numbness or the inability to move the "blocked" extremity may last from 3-48 hours after placement. The length of time depends on the medication injected and your individual response to the medication. If the numbness is not going away after 48 hours, call your surgeon. ° °2. The extremity that is blocked will need to be protected until the numbness is gone and the  Strength has returned. Because you cannot feel it, you will need to take extra care to avoid injury. Because it may be weak, you may have difficulty moving it or using it. You may not know what position it is in without looking at it while the block is in effect. ° °3. For blocks in the legs and feet, returning to weight bearing and walking needs to be done carefully. You will need to wait until the numbness is entirely gone and the strength has returned. You should be able to move your leg and foot normally before you try and bear weight or walk. You will need someone to be with you when you first try to ensure you do not fall and possibly risk injury. ° °4. Bruising and tenderness at the needle site are common side effects and will resolve in a few days. ° °5. Persistent numbness or new problems with movement should be communicated to the surgeon or the [facility] ([phone])/ [facility] ([phone]). ° ° ° °Post Anesthesia Home Care Instructions ° °Activity: °Get plenty of rest for the remainder of the day. A responsible adult should stay with you for 24 hours following the procedure.  °For the next 24 hours, DO NOT: °-Drive a car °-Operate machinery °-Drink alcoholic beverages °-Take any medication unless instructed by your physician °-Make any legal decisions or sign important papers. ° °Meals: °Start with liquid foods such as gelatin or soup. Progress to regular foods as tolerated. Avoid greasy, spicy, heavy foods. If nausea and/or vomiting occur, drink only clear liquids until the  nausea and/or vomiting subsides. Call your physician if vomiting continues. ° °Special Instructions/Symptoms: °Your throat may feel dry or sore from the anesthesia or the breathing tube placed in your throat during surgery. If this causes discomfort, gargle with warm salt water. The discomfort should disappear within 24 hours. ° °

## 2014-04-09 NOTE — Progress Notes (Signed)
Assisted Dr. Fitzgerald with left, ultrasound guided, interscalene  block. Side rails up, monitors on throughout procedure. See vital signs in flow sheet. Tolerated Procedure well. 

## 2014-04-09 NOTE — Anesthesia Procedure Notes (Addendum)
Anesthesia Regional Block:  Interscalene brachial plexus block  Pre-Anesthetic Checklist: ,, timeout performed, Correct Patient, Correct Site, Correct Laterality, Correct Procedure, Correct Position, site marked, Risks and benefits discussed, pre-op evaluation,  At surgeon's request and post-op pain management  Laterality: Left  Prep: Maximum Sterile Barrier Precautions used and chloraprep       Needles:  Injection technique: Single-shot  Needle Type: Echogenic Stimulator Needle     Needle Length: 5cm 5 cm Needle Gauge: 22 and 22 G    Additional Needles:  Procedures: ultrasound guided (picture in chart) and nerve stimulator Interscalene brachial plexus block  Nerve Stimulator or Paresthesia:  Response: Biceps response,   Additional Responses:   Narrative:  Start time: 04/09/2014 12:34 PM End time: 04/09/2014 12:39 PM Injection made incrementally with aspirations every 5 mL. Anesthesiologist: Sampson GoonFitzgerald, MD  Additional Notes: 2% Lidocaine skin wheel.    Procedure Name: Intubation Date/Time: 04/09/2014 1:03 PM Performed by: Caren MacadamARTER, Ellysia Char W Pre-anesthesia Checklist: Patient identified, Emergency Drugs available, Suction available and Patient being monitored Patient Re-evaluated:Patient Re-evaluated prior to inductionOxygen Delivery Method: Circle System Utilized Preoxygenation: Pre-oxygenation with 100% oxygen Intubation Type: IV induction Ventilation: Mask ventilation without difficulty Laryngoscope Size: Miller and 2 (small mouth opening, used glide scope on second look) Grade View: Grade III Tube type: Oral Number of attempts: 1 Airway Equipment and Method: stylet and oral airway Placement Confirmation: ETT inserted through vocal cords under direct vision,  positive ETCO2 and breath sounds checked- equal and bilateral Secured at: 22 cm Tube secured with: Tape Dental Injury: Teeth and Oropharynx as per pre-operative assessment  Difficulty Due To: Difficulty was  unanticipated, Difficult Airway- due to reduced neck mobility and Difficult Airway- due to limited oral opening Future Recommendations: Recommend- induction with short-acting agent, and alternative techniques readily available Comments: Pre op patient opened mouth [id number]. After induction, difficulty opening mouth at all. Placed mil 2 blade but unable to advance it to see cords. Removed quickly and used glide scope to place ett with ease. Used rigid stylet. Great view of cords, passed easily. =BBS +eTCO2. EF at bedside.

## 2014-04-09 NOTE — Interval H&P Note (Signed)
History and Physical Interval Note:  04/09/2014 12:13 PM  Russell Hayden  has presented today for surgery, with the diagnosis of LEFT SHOULDER IMPINGMENT,LEFT KNEE MEDIAL MENISCAL TEAR  The various methods of treatment have been discussed with the patient and family. After consideration of risks, benefits and other options for treatment, the patient has consented to  Procedure(s): LEFT ARTHROSCOPY SHOULDER POSSIBLE ROTATOR CUFF REPAIR (Left) LEFT ARTHROSCOPY KNEE (Left) as a surgical intervention .  The patient's history has been reviewed, patient examined, no change in status, stable for surgery.  I have reviewed the patient's chart and labs.  Questions were answered to the patient's satisfaction.     Nestor LewandowskyFrank J Maleena Eddleman

## 2014-04-09 NOTE — Anesthesia Preprocedure Evaluation (Signed)
Anesthesia Evaluation  Patient identified by MRN, date of birth, ID band Patient awake    Reviewed: Allergy & Precautions, H&P , NPO status , Patient's Chart, lab work & pertinent test results  Airway Mallampati: II TM Distance: >3 FB Neck ROM: Full    Dental no notable dental hx. (+) Teeth Intact, Dental Advisory Given   Pulmonary sleep apnea and Continuous Positive Airway Pressure Ventilation ,  breath sounds clear to auscultation  Pulmonary exam normal       Cardiovascular negative cardio ROS  Rhythm:Regular Rate:Normal     Neuro/Psych Anxiety Depression negative neurological ROS     GI/Hepatic negative GI ROS, Neg liver ROS,   Endo/Other  negative endocrine ROS  Renal/GU negative Renal ROS  negative genitourinary   Musculoskeletal   Abdominal   Peds  Hematology negative hematology ROS (+)   Anesthesia Other Findings   Reproductive/Obstetrics negative OB ROS                           Anesthesia Physical Anesthesia Plan  ASA: III  Anesthesia Plan: General and Regional   Post-op Pain Management:    Induction: Intravenous  Airway Management Planned: Oral ETT  Additional Equipment:   Intra-op Plan:   Post-operative Plan: Extubation in OR  Informed Consent: I have reviewed the patients History and Physical, chart, labs and discussed the procedure including the risks, benefits and alternatives for the proposed anesthesia with the patient or authorized representative who has indicated his/her understanding and acceptance.   Dental advisory given  Plan Discussed with: CRNA  Anesthesia Plan Comments:         Anesthesia Quick Evaluation

## 2014-04-10 NOTE — Anesthesia Postprocedure Evaluation (Signed)
  Anesthesia Post-op Note  Patient: Vella RedheadMark A Tolen  Procedure(s) Performed: Procedure(s): LEFT ARTHROSCOPY SHOULDER, DISTAL CLAVICLE EXCISION, ACROMIOPLASTY (Left) LEFT ARTHROSCOPY KNEE, CHONDROPLASTY (Left)  Patient Location: PACU  Anesthesia Type:General and block  Level of Consciousness: awake and alert   Airway and Oxygen Therapy: Patient Spontanous Breathing  Post-op Pain: none  Post-op Assessment: Post-op Vital signs reviewed, Patient's Cardiovascular Status Stable and Respiratory Function Stable  Post-op Vital Signs: Reviewed  Filed Vitals:   04/09/14 1600  BP: 156/75  Pulse: 65  Temp: 36.5 C  Resp: 20    Complications: No apparent anesthesia complications

## 2014-04-16 ENCOUNTER — Encounter (HOSPITAL_BASED_OUTPATIENT_CLINIC_OR_DEPARTMENT_OTHER): Payer: Self-pay | Admitting: Orthopedic Surgery

## 2014-07-03 ENCOUNTER — Encounter (HOSPITAL_COMMUNITY): Payer: Self-pay | Admitting: Emergency Medicine

## 2014-07-03 ENCOUNTER — Inpatient Hospital Stay (HOSPITAL_COMMUNITY)
Admission: EM | Admit: 2014-07-03 | Discharge: 2014-07-05 | DRG: 866 | Disposition: A | Discharge status: Home / Self Care | Payer: BC Managed Care – PPO | Attending: Internal Medicine | Admitting: Internal Medicine

## 2014-07-03 ENCOUNTER — Emergency Department (HOSPITAL_COMMUNITY): Payer: BC Managed Care – PPO

## 2014-07-03 DIAGNOSIS — R6889 Other general symptoms and signs: Secondary | ICD-10-CM

## 2014-07-03 DIAGNOSIS — F329 Major depressive disorder, single episode, unspecified: Secondary | ICD-10-CM | POA: Diagnosis present

## 2014-07-03 DIAGNOSIS — Z825 Family history of asthma and other chronic lower respiratory diseases: Secondary | ICD-10-CM

## 2014-07-03 DIAGNOSIS — Z8249 Family history of ischemic heart disease and other diseases of the circulatory system: Secondary | ICD-10-CM | POA: Diagnosis not present

## 2014-07-03 DIAGNOSIS — Z880 Allergy status to penicillin: Secondary | ICD-10-CM | POA: Diagnosis not present

## 2014-07-03 DIAGNOSIS — W57XXXA Bitten or stung by nonvenomous insect and other nonvenomous arthropods, initial encounter: Secondary | ICD-10-CM

## 2014-07-03 DIAGNOSIS — R509 Fever, unspecified: Secondary | ICD-10-CM

## 2014-07-03 DIAGNOSIS — Z806 Family history of leukemia: Secondary | ICD-10-CM

## 2014-07-03 DIAGNOSIS — G4733 Obstructive sleep apnea (adult) (pediatric): Secondary | ICD-10-CM | POA: Diagnosis present

## 2014-07-03 DIAGNOSIS — G8929 Other chronic pain: Secondary | ICD-10-CM | POA: Diagnosis present

## 2014-07-03 DIAGNOSIS — A938 Other specified arthropod-borne viral fevers: Secondary | ICD-10-CM | POA: Diagnosis present

## 2014-07-03 DIAGNOSIS — M545 Low back pain, unspecified: Secondary | ICD-10-CM | POA: Diagnosis present

## 2014-07-03 DIAGNOSIS — E785 Hyperlipidemia, unspecified: Secondary | ICD-10-CM | POA: Diagnosis present

## 2014-07-03 DIAGNOSIS — K589 Irritable bowel syndrome without diarrhea: Secondary | ICD-10-CM | POA: Diagnosis present

## 2014-07-03 DIAGNOSIS — B9789 Other viral agents as the cause of diseases classified elsewhere: Principal | ICD-10-CM | POA: Diagnosis present

## 2014-07-03 DIAGNOSIS — R000 Tachycardia, unspecified: Secondary | ICD-10-CM | POA: Diagnosis present

## 2014-07-03 DIAGNOSIS — F411 Generalized anxiety disorder: Secondary | ICD-10-CM | POA: Diagnosis present

## 2014-07-03 DIAGNOSIS — Z808 Family history of malignant neoplasm of other organs or systems: Secondary | ICD-10-CM | POA: Diagnosis not present

## 2014-07-03 DIAGNOSIS — B349 Viral infection, unspecified: Secondary | ICD-10-CM

## 2014-07-03 DIAGNOSIS — F3289 Other specified depressive episodes: Secondary | ICD-10-CM | POA: Diagnosis present

## 2014-07-03 DIAGNOSIS — Z79899 Other long term (current) drug therapy: Secondary | ICD-10-CM | POA: Diagnosis not present

## 2014-07-03 LAB — CBC
HCT: 40.6 % (ref 39.0–52.0)
Hemoglobin: 13.9 g/dL (ref 13.0–17.0)
MCH: 31 pg (ref 26.0–34.0)
MCHC: 34.2 g/dL (ref 30.0–36.0)
MCV: 90.6 fL (ref 78.0–100.0)
Platelets: 145 10*3/uL — ABNORMAL LOW (ref 150–400)
RBC: 4.48 MIL/uL (ref 4.22–5.81)
RDW: 13.3 % (ref 11.5–15.5)
WBC: 12 10*3/uL — AB (ref 4.0–10.5)

## 2014-07-03 LAB — COMPREHENSIVE METABOLIC PANEL
ALBUMIN: 4 g/dL (ref 3.5–5.2)
ALT: 58 U/L — AB (ref 0–53)
AST: 37 U/L (ref 0–37)
Alkaline Phosphatase: 72 U/L (ref 39–117)
Anion gap: 13 (ref 5–15)
BUN: 15 mg/dL (ref 6–23)
CALCIUM: 9.9 mg/dL (ref 8.4–10.5)
CO2: 25 mEq/L (ref 19–32)
Chloride: 98 mEq/L (ref 96–112)
Creatinine, Ser: 0.98 mg/dL (ref 0.50–1.35)
GFR calc non Af Amer: >90 mL/min (ref >90)
GLUCOSE: 90 mg/dL (ref 70–99)
POTASSIUM: 4.1 meq/L (ref 3.7–5.3)
Sodium: 136 mEq/L — ABNORMAL LOW (ref 137–147)
TOTAL PROTEIN: 7.3 g/dL (ref 6.0–8.3)
Total Bilirubin: 0.3 mg/dL (ref 0.3–1.2)

## 2014-07-03 LAB — I-STAT CG4 LACTIC ACID, ED: LACTIC ACID, VENOUS: 2.22 mmol/L — AB (ref 0.5–2.2)

## 2014-07-03 LAB — CBC WITH DIFFERENTIAL/PLATELET
BASOS ABS: 0 10*3/uL (ref 0.0–0.1)
Basophils Relative: 0 % (ref 0–1)
EOS ABS: 0.1 10*3/uL (ref 0.0–0.7)
Eosinophils Relative: 1 % (ref 0–5)
HCT: 45.6 % (ref 39.0–52.0)
Hemoglobin: 15.6 g/dL (ref 13.0–17.0)
LYMPHS ABS: 0.9 10*3/uL (ref 0.7–4.0)
Lymphocytes Relative: 7 % — ABNORMAL LOW (ref 12–46)
MCH: 31.4 pg (ref 26.0–34.0)
MCHC: 34.2 g/dL (ref 30.0–36.0)
MCV: 91.8 fL (ref 78.0–100.0)
Monocytes Absolute: 0.8 10*3/uL (ref 0.1–1.0)
Monocytes Relative: 6 % (ref 3–12)
NEUTROS PCT: 86 % — AB (ref 43–77)
Neutro Abs: 11.5 10*3/uL — ABNORMAL HIGH (ref 1.7–7.7)
PLATELETS: 165 10*3/uL (ref 150–400)
RBC: 4.97 MIL/uL (ref 4.22–5.81)
RDW: 13.4 % (ref 11.5–15.5)
WBC: 13.3 10*3/uL — ABNORMAL HIGH (ref 4.0–10.5)

## 2014-07-03 LAB — URINALYSIS, ROUTINE W REFLEX MICROSCOPIC
Bilirubin Urine: NEGATIVE
Glucose, UA: NEGATIVE mg/dL
Hgb urine dipstick: NEGATIVE
Ketones, ur: NEGATIVE mg/dL
Leukocytes, UA: NEGATIVE
NITRITE: NEGATIVE
PH: 5.5 (ref 5.0–8.0)
Protein, ur: NEGATIVE mg/dL
SPECIFIC GRAVITY, URINE: 1.022 (ref 1.005–1.030)
Urobilinogen, UA: 0.2 mg/dL (ref 0.0–1.0)

## 2014-07-03 LAB — CREATININE, SERUM
CREATININE: 0.87 mg/dL (ref 0.50–1.35)
GFR calc non Af Amer: >90 mL/min (ref >90)

## 2014-07-03 MED ORDER — SODIUM CHLORIDE 0.9 % IV SOLN
Freq: Once | INTRAVENOUS | Status: AC
  Administered 2014-07-03: 21:00:00 via INTRAVENOUS

## 2014-07-03 MED ORDER — OXYCODONE HCL 5 MG PO TABS
5.0000 mg | ORAL_TABLET | ORAL | Status: DC | PRN
  Administered 2014-07-03 – 2014-07-05 (×3): 5 mg via ORAL
  Filled 2014-07-03 (×3): qty 1

## 2014-07-03 MED ORDER — SODIUM CHLORIDE 0.9 % IV SOLN
INTRAVENOUS | Status: DC
  Administered 2014-07-03 – 2014-07-05 (×4): via INTRAVENOUS

## 2014-07-03 MED ORDER — DOCUSATE SODIUM 100 MG PO CAPS
100.0000 mg | ORAL_CAPSULE | Freq: Two times a day (BID) | ORAL | Status: DC
  Administered 2014-07-03 – 2014-07-05 (×4): 100 mg via ORAL
  Filled 2014-07-03 (×5): qty 1

## 2014-07-03 MED ORDER — MORPHINE SULFATE 4 MG/ML IJ SOLN
4.0000 mg | Freq: Once | INTRAMUSCULAR | Status: AC
  Administered 2014-07-03: 4 mg via INTRAVENOUS
  Filled 2014-07-03: qty 1

## 2014-07-03 MED ORDER — ADULT MULTIVITAMIN W/MINERALS CH
1.0000 | ORAL_TABLET | Freq: Every day | ORAL | Status: DC
  Administered 2014-07-03 – 2014-07-05 (×3): 1 via ORAL
  Filled 2014-07-03 (×3): qty 1

## 2014-07-03 MED ORDER — ACETAMINOPHEN 325 MG PO TABS
650.0000 mg | ORAL_TABLET | Freq: Four times a day (QID) | ORAL | Status: DC | PRN
  Administered 2014-07-04 – 2014-07-05 (×3): 650 mg via ORAL
  Filled 2014-07-03 (×3): qty 2

## 2014-07-03 MED ORDER — METRONIDAZOLE 500 MG PO TABS
500.0000 mg | ORAL_TABLET | Freq: Four times a day (QID) | ORAL | Status: DC
  Administered 2014-07-03 – 2014-07-05 (×7): 500 mg via ORAL
  Filled 2014-07-03 (×10): qty 1

## 2014-07-03 MED ORDER — MAGNESIUM CITRATE PO SOLN: 1.0000 | Freq: Once | ORAL | Status: AC | PRN

## 2014-07-03 MED ORDER — DICLOFENAC SODIUM 75 MG PO TBEC
75.0000 mg | DELAYED_RELEASE_TABLET | Freq: Two times a day (BID) | ORAL | Status: DC
  Administered 2014-07-03 – 2014-07-05 (×4): 75 mg via ORAL
  Filled 2014-07-03 (×6): qty 1

## 2014-07-03 MED ORDER — SODIUM CHLORIDE 0.9 % IJ SOLN
3.0000 mL | Freq: Two times a day (BID) | INTRAMUSCULAR | Status: DC
  Administered 2014-07-05: 3 mL via INTRAVENOUS

## 2014-07-03 MED ORDER — SODIUM CHLORIDE 0.9 % IV BOLUS (SEPSIS)
1000.0000 mL | Freq: Once | INTRAVENOUS | Status: AC
  Administered 2014-07-03: 1000 mL via INTRAVENOUS

## 2014-07-03 MED ORDER — DOXYCYCLINE HYCLATE 100 MG IV SOLR
100.0000 mg | Freq: Once | INTRAVENOUS | Status: AC
  Administered 2014-07-03: 100 mg via INTRAVENOUS
  Filled 2014-07-03: qty 100

## 2014-07-03 MED ORDER — SODIUM CHLORIDE 0.9 % IV BOLUS (SEPSIS)
1000.0000 mL | INTRAVENOUS | Status: AC
  Administered 2014-07-03: 1000 mL via INTRAVENOUS

## 2014-07-03 MED ORDER — ATORVASTATIN CALCIUM 40 MG PO TABS
40.0000 mg | ORAL_TABLET | Freq: Every day | ORAL | Status: DC
  Filled 2014-07-03 (×2): qty 1

## 2014-07-03 MED ORDER — VITAMIN B-1 100 MG PO TABS
100.0000 mg | ORAL_TABLET | Freq: Every day | ORAL | Status: DC
  Administered 2014-07-03 – 2014-07-05 (×3): 100 mg via ORAL
  Filled 2014-07-03 (×3): qty 1

## 2014-07-03 MED ORDER — FOLIC ACID 1 MG PO TABS
1.0000 mg | ORAL_TABLET | Freq: Every day | ORAL | Status: DC
  Administered 2014-07-03 – 2014-07-05 (×3): 1 mg via ORAL
  Filled 2014-07-03 (×3): qty 1

## 2014-07-03 MED ORDER — ALUM & MAG HYDROXIDE-SIMETH 200-200-20 MG/5ML PO SUSP: 30.0000 mL | Freq: Four times a day (QID) | ORAL | Status: DC | PRN

## 2014-07-03 MED ORDER — ACETAMINOPHEN 650 MG RE SUPP: 650.0000 mg | Freq: Four times a day (QID) | RECTAL | Status: DC | PRN

## 2014-07-03 MED ORDER — ONDANSETRON HCL 4 MG/2ML IJ SOLN: 4.0000 mg | Freq: Four times a day (QID) | INTRAMUSCULAR | Status: DC | PRN

## 2014-07-03 MED ORDER — SERTRALINE HCL 100 MG PO TABS
100.0000 mg | ORAL_TABLET | Freq: Every day | ORAL | Status: DC
  Administered 2014-07-04 – 2014-07-05 (×2): 100 mg via ORAL
  Filled 2014-07-03 (×2): qty 1

## 2014-07-03 MED ORDER — ONDANSETRON HCL 4 MG PO TABS: 4.0000 mg | ORAL_TABLET | Freq: Four times a day (QID) | ORAL | Status: DC | PRN

## 2014-07-03 MED ORDER — DOXYCYCLINE HYCLATE 100 MG IV SOLR
100.0000 mg | Freq: Two times a day (BID) | INTRAVENOUS | Status: DC
  Administered 2014-07-04 – 2014-07-05 (×3): 100 mg via INTRAVENOUS
  Filled 2014-07-03 (×4): qty 100

## 2014-07-03 MED ORDER — ONDANSETRON HCL 4 MG/2ML IJ SOLN
4.0000 mg | Freq: Once | INTRAMUSCULAR | Status: AC
  Administered 2014-07-03: 4 mg via INTRAVENOUS
  Filled 2014-07-03: qty 2

## 2014-07-03 MED ORDER — ARMODAFINIL 150 MG PO TABS: 150.0000 mg | ORAL_TABLET | Freq: Every day | ORAL | Status: DC

## 2014-07-03 MED ORDER — ZOLPIDEM TARTRATE 5 MG PO TABS: 5.0000 mg | ORAL_TABLET | Freq: Every evening | ORAL | Status: DC | PRN

## 2014-07-03 MED ORDER — IBUPROFEN 800 MG PO TABS
800.0000 mg | ORAL_TABLET | Freq: Once | ORAL | Status: AC
  Administered 2014-07-03: 800 mg via ORAL
  Filled 2014-07-03: qty 1

## 2014-07-03 MED ORDER — SORBITOL 70 % SOLN
30.0000 mL | Freq: Every day | Status: DC | PRN
  Filled 2014-07-03: qty 30

## 2014-07-03 NOTE — ED Notes (Signed)
Pt resting soundly after morphine, o2 applied for comfort.

## 2014-07-03 NOTE — ED Provider Notes (Signed)
CSN: [id number]     Arrival date & time 07/03/14  1743 History   First MD Initiated Contact with Patient 07/03/14 1749     Chief Complaint  Patient presents with  . Fever  . Generalized Body Aches     (Consider location/radiation/quality/duration/timing/severity/associated sxs/prior Treatment) The history is provided by the patient and medical records. No language interpreter was used.    Russell Hayden is a 53 y.o. male  with a hx of IBS, anxiety, depression, chronic low back pain presents to the Emergency Department complaining of gradual, persistent, progressively worsening general myalgias onset this morning, worsening 1 hour ago.  Pt reports not feeling well throughout the weekend, but cannot be specific with me.  His wife at bedside reports that he did not complain throughout the weekend about anything specific.  Associated symptoms include subjective fevers, chills.  NO aggravating or alleviating factors.  Pt reports taking 2 tylenol at lunch without relief.  Pt reports 3 weeks ago he had a tick bite and wife reports at the time he had a very large red spot surrounding the site but was not evaluated for this.  He also reports pulling a tick off of him 1 week ago.  Pt denies neck stiffness, weakness, abd pain, vomiting, diarrhea, syncope, dysuria, cough, congestion, sick contacts.  Denies international travel.     Past Medical History  Diagnosis Date  . Hyperlipidemia   . IBS (irritable bowel syndrome)   . Anxiety   . Depression   . Chronic low back pain   . Allergic rhinitis   . Idiopathic thrombocytopenia   . OSA (obstructive sleep apnea)     does use a cpap   Past Surgical History  Procedure Laterality Date  . Laminectomy      approx 2000  . Nasal sinus surgery      approx late 1990s.  deviated septum  . Spine surgery    . Tonsillectomy    . Colonoscopy    . Knee arthroscopy  02/01/2013    Procedure: ARTHROSCOPY KNEE;  Surgeon: Nestor Lewandowsky, MD;  Location: [facility];  Service: Orthopedics;  Laterality: Left;  . Shoulder arthroscopy Left 04/09/2014    Procedure: LEFT ARTHROSCOPY SHOULDER, DISTAL CLAVICLE EXCISION, ACROMIOPLASTY;  Surgeon: Nestor Lewandowsky, MD;  Location: [facility];  Service: Orthopedics;  Laterality: Left;  . Knee arthroscopy Left 04/09/2014    Procedure: LEFT ARTHROSCOPY KNEE, CHONDROPLASTY;  Surgeon: Nestor Lewandowsky, MD;  Location: [facility];  Service: Orthopedics;  Laterality: Left;   Family History  Problem Relation Age of Onset  . Hyperlipidemia Mother   . Hypertension Mother   . Celiac disease Mother   . Hypertension Brother   . Celiac disease Brother   . Leukemia Father   . Skin cancer Father   . Cancer Paternal Grandmother   . Asthma Mother   . Allergies Mother    History  Substance Use Topics  . Smoking status: Never Smoker   . Smokeless tobacco: Not on file  . Alcohol Use: Yes     Comment: occ    Review of Systems  Constitutional: Positive for fever and chills. Negative for diaphoresis, appetite change, fatigue and unexpected weight change.       Rigors  HENT: Negative for mouth sores.   Eyes: Negative for visual disturbance.  Respiratory: Negative for cough, chest tightness, shortness of breath and wheezing.   Cardiovascular: Negative for chest pain.  Gastrointestinal: Negative for  nausea, vomiting, abdominal pain, diarrhea and constipation.  Endocrine: Negative for polydipsia, polyphagia and polyuria.  Genitourinary: Negative for dysuria, urgency, frequency and hematuria.  Musculoskeletal: Positive for myalgias. Negative for back pain and neck stiffness.  Skin: Negative for rash.  Allergic/Immunologic: Negative for immunocompromised state.  Neurological: Positive for headaches. Negative for syncope and light-headedness.  Hematological: Does not bruise/bleed easily.  Psychiatric/Behavioral: Negative for sleep disturbance. The patient is not nervous/anxious.        Allergies  Penicillins  Home Medications   Prior to Admission medications   Medication Sig Start Date End Date Taking? Authorizing Provider  acetaminophen (TYLENOL) 500 MG tablet Take 1,000 mg by mouth every 6 (six) hours as needed for mild pain.   Yes Historical Provider, MD  Armodafinil (NUVIGIL) 150 MG tablet Take 150 mg by mouth daily.     Yes Historical Provider, MD  diclofenac (VOLTAREN) 75 MG EC tablet Take 75 mg by mouth 2 (two) times daily.   Yes Historical Provider, MD  rosuvastatin (CRESTOR) 20 MG tablet Take 40 mg by mouth daily.    Yes Historical Provider, MD  sertraline (ZOLOFT) 100 MG tablet Take 100 mg by mouth daily.     Yes Historical Provider, MD   BP 115/54  Pulse 99  Temp(Src) 101.8 F (38.8 C) (Rectal)  Resp 25  SpO2 98% Physical Exam  Nursing note and vitals reviewed. Constitutional: He appears well-developed and well-nourished. He appears distressed.  Awake, alert, Pt with rigors  HENT:  Head: Normocephalic and atraumatic.  Mouth/Throat: Oropharynx is clear and moist. No oropharyngeal exudate.  Eyes: Conjunctivae are normal. No scleral icterus.  Neck: Normal range of motion. Neck supple.  Cardiovascular: Regular rhythm, normal heart sounds and intact distal pulses.   Tachycardia  Pulmonary/Chest: Effort normal and breath sounds normal. No respiratory distress. He has no wheezes. He has no rales.  Clear and equal breath sounds  Abdominal: Soft. Bowel sounds are normal. He exhibits no distension and no mass. There is no tenderness. There is no rebound and no guarding.  Soft without rebound or guarding  Musculoskeletal: Normal range of motion. He exhibits no edema.  Tenderness to palpation over the entire body  Neurological: He is alert.  Speech is clear and goal oriented Moves extremities without ataxia  Skin: Skin is warm. He is diaphoretic. No erythema.  Red and hot to the touch No visible rash   Psychiatric: He has a normal mood and  affect.    ED Course  Procedures (including critical care time) Labs Review Labs Reviewed  CBC WITH DIFFERENTIAL - Abnormal; Notable for the following:    WBC 13.3 (*)    Neutrophils Relative % 86 (*)    Neutro Abs 11.5 (*)    Lymphocytes Relative 7 (*)    All other components within normal limits  COMPREHENSIVE METABOLIC PANEL - Abnormal; Notable for the following:    Sodium 136 (*)    ALT 58 (*)    All other components within normal limits  I-STAT CG4 LACTIC ACID, ED - Abnormal; Notable for the following:    Lactic Acid, Venous 2.22 (*)    All other components within normal limits  CULTURE, BLOOD (ROUTINE X 2)  CULTURE, BLOOD (ROUTINE X 2)  URINALYSIS, ROUTINE W REFLEX MICROSCOPIC  ROCKY MTN SPOTTED FVR AB, IGG-BLOOD  ROCKY MTN SPOTTED FVR AB, IGM-BLOOD  B. BURGDORFI ANTIBODIES BY WB  EHRLICHIA ANTIBODY PANEL    Imaging Review Dg Chest 2 View  07/03/2014   CLINICAL  DATA:  Fever, chills, weakness.  EXAM: CHEST  2 VIEW  COMPARISON:  05/31/2014  FINDINGS: Low lung volumes with minimal bibasilar atelectasis. Heart is normal size. No effusions. No acute bony abnormality.  IMPRESSION: Low lung volumes, bibasilar atelectasis.   Electronically Signed   By: Charlett NoseKevin  Dover M.D.   On: 07/03/2014 18:19     EKG Interpretation None      CRITICAL CARE Performed by: Dierdre ForthMuthersbaugh, Desman Polak Total critical care time: 35min Critical care time was exclusive of separately billable procedures and treating other patients. Critical care was necessary to treat or prevent imminent or life-threatening deterioration. Critical care was time spent personally by me on the following activities: development of treatment plan with patient and/or surrogate as well as nursing, discussions with consultants, evaluation of patient's response to treatment, examination of patient, obtaining history from patient or surrogate, ordering and performing treatments and interventions, ordering and review of laboratory  studies, ordering and review of radiographic studies, pulse oximetry and re-evaluation of patient's condition.   MDM   Final diagnoses:  Fever and chills  Rigors  Tick bite  Tachycardia   Vella RedheadMark A Nucci presents with fever, rigors, tachycardia and hx of tick bite.  Undressed physical exam performed without evidence of rash or cellulitis.  Pt distressed, diaphoretic on arrival.  Pt meets SIRS criteria.    8:13 PM  Pt with mild hyponatremia, elevated lactic acid and elevated ALT - consistent with RMSF.  Lyme, Ehrlichiosis and RMSF titers pending.  Blood cultures taken and sent.  Pt has been given 2L of fluid, tylenol and ibuprofen.   Mild leukocytosis on CBC.  No anemia noted.   UA without evidence of UTI, CXR without evidence of PNA - no evidence of ARDS.  Pt to be admitted to the hospitalist, likely step-down bed.    Repeat rectal temp 101.8  The patient was discussed with and seen by Dr. Linwood DibblesJon Knapp who agrees with the treatment plan.   Dahlia ClientHannah Nyquan Selbe, PA-C 07/03/14 2016  Dierdre ForthHannah Kobee Medlen, PA-C 07/03/14 2018

## 2014-07-03 NOTE — ED Notes (Signed)
Pt felt like he could BM, urinated and relieved pressure. No stool to test at present moment.

## 2014-07-03 NOTE — ED Notes (Signed)
Pt to radiology.

## 2014-07-03 NOTE — ED Notes (Signed)
Initial Contact - pt resting on stretcher with family at bedside, pt appears uncomfortable, difficult to assess 2/2 moaning.  Family reports pt was bitten by a tick x3 weeks ago and had a rash at that time.  Skin PWD, no rashes present currently.  Pt reports feeling unwell only today, c/o pain all over, nausea.  A+OX4.  Speaking full/clear sentences.  Changed to hospital gown.  NAD.

## 2014-07-03 NOTE — ED Notes (Signed)
Pt feeling too groggy to stand for UA, will try again

## 2014-07-03 NOTE — ED Notes (Signed)
Pt reports he was bitten by a tick 3 weeks ago. Pt states today he has had body aches, fever and chills since today. Pt arrives very anxious and shaking. Pt alert, no acute distress. Pt appears diaphoretic.

## 2014-07-03 NOTE — ED Notes (Signed)
Attempted to call report to floor, RN unavailable

## 2014-07-03 NOTE — ED Provider Notes (Signed)
Medical screening examination/treatment/procedure(s) were conducted as a shared visit with non-physician practitioner(s) and myself.  I personally evaluated the patient during the encounter.  Pt presents with myalgias , fever, recent tick bite.    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  Serologies sent for analysis.  IV doxycycline started.  With his elevated lactic acid level will admit for further evaluation and treatment.  Linwood DibblesJon Olanrewaju Osborn, MD 07/03/14 2025

## 2014-07-03 NOTE — H&P (Signed)
[facility] History and Physical  Russell RedheadMark A Delima ZOX:[id number] DOB: 12/05/1961 DOA: 07/03/2014  Referring physician: Zadie RhineHannah Muthersburg, MD PCP: Londell MohPHARR,WALTER DAVIDSON, MD   Chief Complaint: Fever  HPI: Russell Hayden is a 53 y.o. male who states that about 3 weeks ago had a tick on his body. Patients wife removed the tick .She states that later on she noted a red erythematous lesion on his back. Patient was recently in the mountains also and was drinking well water,. He also admits to having diarrhea. Patient today developed sudden onset of fevers to 104 and had severe chills noted. He states that he had been having body aches also. Patient felt extremely weak. He was not disoriented. He had a headache but denies having any stiffness in his neck. Patient states he also had diarrhea which was rather severe and explosive. Patient came into the ED and was noted to be tachycardic and so is being admitted for possible RMSF.   Review of Systems:  Constitutional:  No weight loss, ++night sweats, ++Fevers, ++chills.  HEENT:  No headaches, Difficulty swallowing  Cardio-vascular:  No chest pain, Orthopnea, PND, swelling in lower extremities  GI:  No heartburn, indigestion, abdominal pain, nausea, vomiting, ++diarrhea, ++change in bowel habits, ++loss of appetite  Resp:  No shortness of breath with exertion or at rest. No excess mucus, no productive cough, No non-productive cough  Skin:  no rash or lesions.  GU:  no dysuria, change in color of urine, no urgency or frequency Musculoskeletal:  No joint pain or swelling. ++ chronic back pain.  Psych:  No change in mood or affect.   Past Medical History  Diagnosis Date  . Hyperlipidemia   . IBS (irritable bowel syndrome)   . Anxiety   . Depression   . Chronic low back pain   . Allergic rhinitis   . Idiopathic thrombocytopenia   . OSA (obstructive sleep apnea)     does use a cpap   Past Surgical History  Procedure Laterality Date  .  Laminectomy      approx 2000  . Nasal sinus surgery      approx late 1990s.  deviated septum  . Spine surgery    . Tonsillectomy    . Colonoscopy    . Knee arthroscopy  02/01/2013    Procedure: ARTHROSCOPY KNEE;  Surgeon: Nestor LewandowskyFrank J Rowan, MD;  Location: [facility];  Service: Orthopedics;  Laterality: Left;  . Shoulder arthroscopy Left 04/09/2014    Procedure: LEFT ARTHROSCOPY SHOULDER, DISTAL CLAVICLE EXCISION, ACROMIOPLASTY;  Surgeon: Nestor LewandowskyFrank J Rowan, MD;  Location: [facility];  Service: Orthopedics;  Laterality: Left;  . Knee arthroscopy Left 04/09/2014    Procedure: LEFT ARTHROSCOPY KNEE, CHONDROPLASTY;  Surgeon: Nestor LewandowskyFrank J Rowan, MD;  Location: [facility];  Service: Orthopedics;  Laterality: Left;   Social History:  reports that he has never smoked. He does not have any smokeless tobacco history on file. He reports that he drinks alcohol. He reports that he does not use illicit drugs.  Allergies  Allergen Reactions  . Penicillins     Itching and rash    Family History  Problem Relation Age of Onset  . Hyperlipidemia Mother   . Hypertension Mother   . Celiac disease Mother   . Hypertension Brother   . Celiac disease Brother   . Leukemia Father   . Skin cancer Father   . Cancer Paternal Grandmother   . Asthma Mother   . Allergies Mother  Prior to Admission medications   Medication Sig Start Date End Date Taking? Authorizing Provider  acetaminophen (TYLENOL) 500 MG tablet Take 1,000 mg by mouth every 6 (six) hours as needed for mild pain.   Yes Historical Provider, MD  Armodafinil (NUVIGIL) 150 MG tablet Take 150 mg by mouth daily.     Yes Historical Provider, MD  diclofenac (VOLTAREN) 75 MG EC tablet Take 75 mg by mouth 2 (two) times daily.   Yes Historical Provider, MD  rosuvastatin (CRESTOR) 20 MG tablet Take 40 mg by mouth daily.    Yes Historical Provider, MD  sertraline (ZOLOFT) 100 MG tablet Take 100 mg by mouth daily.     Yes  Historical Provider, MD   Physical Exam: Filed Vitals:   07/03/14 1947  BP:   Pulse:   Temp: 101.8 F (38.8 C)  Resp:     BP 115/54  Pulse 99  Temp(Src) 101.8 F (38.8 C) (Rectal)  Resp 25  SpO2 98%  General:  Appears calm and comfortable Eyes: PERRL, normal lids, irises & conjunctiva ENT: grossly normal hearing, lips & tongue Neck: no LAD, masses or thyromegaly Cardiovascular: RRR, no m/r/g. No LE edema. Respiratory: CTA bilaterally, no w/r/r. Normal respiratory effort. Abdomen: soft, c/o tenderness diffusely Skin: no rash or induration seen on limited exam Musculoskeletal: grossly normal tone BUE/BLE Psychiatric: grossly normal mood and affect, speech fluent and appropriate Neurologic: grossly non-focal.          Labs on Admission:  Basic Metabolic Panel:  Recent Labs Lab 07/03/14 1830  NA 136*  K 4.1  CL 98  CO2 25  GLUCOSE 90  BUN 15  CREATININE 0.98  CALCIUM 9.9   Liver Function Tests:  Recent Labs Lab 07/03/14 1830  AST 37  ALT 58*  ALKPHOS 72  BILITOT 0.3  PROT 7.3  ALBUMIN 4.0   No results found for this basename: LIPASE, AMYLASE,  in the last 168 hours No results found for this basename: AMMONIA,  in the last 168 hours CBC:  Recent Labs Lab 07/03/14 1830  WBC 13.3*  NEUTROABS 11.5*  HGB 15.6  HCT 45.6  MCV 91.8  PLT 165   Cardiac Enzymes: No results found for this basename: CKTOTAL, CKMB, CKMBINDEX, TROPONINI,  in the last 168 hours  BNP (last 3 results) No results found for this basename: PROBNP,  in the last 8760 hours CBG: No results found for this basename: GLUCAP,  in the last 168 hours  Radiological Exams on Admission: Dg Chest 2 View  07/03/2014   CLINICAL DATA:  Fever, chills, weakness.  EXAM: CHEST  2 VIEW  COMPARISON:  05/31/2014  FINDINGS: Low lung volumes with minimal bibasilar atelectasis. Heart is normal size. No effusions. No acute bony abnormality.  IMPRESSION: Low lung volumes, bibasilar atelectasis.    Electronically Signed   By: Charlett NoseKevin  Dover M.D.   On: 07/03/2014 18:19      Assessment/Plan Active Problems:   OSA (obstructive sleep apnea)   Fever   Tick bite   Tick fever   1. Tick Fever -the actual tick bite was about 3 weeks ago according to his wife. She also had noted a significant area of erythema afterwards. -in the setting of fever chills and tachycardia would go ahead and start on antibiotics -cultures have been drawn as well as titers -started on doxycycline  2. Diarrhea -recently went to the mountains again and admits to drinking well water.  -no one else was sick but they all drank  bottled water -will start on metronidazole for possible parasitic infection  3. OSA -will start on CPAP as he is at home    Code Status: Full Code (must indicate code status--if unknown or must be presumed, indicate so) Family Communication: Wife (indicate person spoken with, if applicable, with phone number if by telephone) Disposition Plan: Home (indicate anticipated LOS)  Time spent:  [facility] Pager [phone]  **Disclaimer: This note may have been dictated with voice recognition software. Similar sounding words can inadvertently be transcribed and this note may contain transcription errors which may not have been corrected upon publication of note.**

## 2014-07-04 DIAGNOSIS — R509 Fever, unspecified: Secondary | ICD-10-CM

## 2014-07-04 DIAGNOSIS — T148 Other injury of unspecified body region: Secondary | ICD-10-CM

## 2014-07-04 DIAGNOSIS — W57XXXA Bitten or stung by nonvenomous insect and other nonvenomous arthropods, initial encounter: Secondary | ICD-10-CM

## 2014-07-04 DIAGNOSIS — B9789 Other viral agents as the cause of diseases classified elsewhere: Principal | ICD-10-CM

## 2014-07-04 LAB — CBC
HCT: 38.8 % — ABNORMAL LOW (ref 39.0–52.0)
Hemoglobin: 13 g/dL (ref 13.0–17.0)
MCH: 30.7 pg (ref 26.0–34.0)
MCHC: 33.5 g/dL (ref 30.0–36.0)
MCV: 91.7 fL (ref 78.0–100.0)
PLATELETS: 132 10*3/uL — AB (ref 150–400)
RBC: 4.23 MIL/uL (ref 4.22–5.81)
RDW: 13.5 % (ref 11.5–15.5)
WBC: 11.7 10*3/uL — ABNORMAL HIGH (ref 4.0–10.5)

## 2014-07-04 LAB — COMPREHENSIVE METABOLIC PANEL
ALK PHOS: 59 U/L (ref 39–117)
ALT: 51 U/L (ref 0–53)
AST: 28 U/L (ref 0–37)
Albumin: 3 g/dL — ABNORMAL LOW (ref 3.5–5.2)
Anion gap: 9 (ref 5–15)
BUN: 11 mg/dL (ref 6–23)
CALCIUM: 8.4 mg/dL (ref 8.4–10.5)
CO2: 24 meq/L (ref 19–32)
Chloride: 107 mEq/L (ref 96–112)
Creatinine, Ser: 0.85 mg/dL (ref 0.50–1.35)
GFR calc Af Amer: >90 mL/min (ref >90)
GLUCOSE: 107 mg/dL — AB (ref 70–99)
Potassium: 4.1 mEq/L (ref 3.7–5.3)
SODIUM: 140 meq/L (ref 137–147)
TOTAL PROTEIN: 5.7 g/dL — AB (ref 6.0–8.3)
Total Bilirubin: 0.4 mg/dL (ref 0.3–1.2)

## 2014-07-04 LAB — HEMOGLOBIN A1C
Hgb A1c MFr Bld: 5.8 % — ABNORMAL HIGH (ref <5.7)
Mean Plasma Glucose: 120 mg/dL — ABNORMAL HIGH (ref <117)

## 2014-07-04 LAB — B. BURGDORFI ANTIBODIES BY WB
B BURGDORFERI IGM ABS (IB): NEGATIVE
B burgdorferi IgG Abs (IB): NEGATIVE

## 2014-07-04 LAB — ROCKY MTN SPOTTED FVR AB, IGM-BLOOD: RMSF IgM: 0.33 IV (ref 0.00–0.89)

## 2014-07-04 LAB — ROCKY MTN SPOTTED FVR AB, IGG-BLOOD: RMSF IgG: 0.89 IV — ABNORMAL HIGH

## 2014-07-04 LAB — TSH: TSH: 1.83 u[IU]/mL (ref 0.350–4.500)

## 2014-07-04 NOTE — Progress Notes (Signed)
[facility] PROGRESS NOTE   Vella RedheadMark A Costen [id number] DOB: 01/26/1961 DOA: 07/03/2014 PCP: Londell MohPHARR,WALTER DAVIDSON, MD  HPI/Subjective: Seen with wife at bedside, complains about significant generalized body aches.  Assessment/Plan: Active Problems:   OSA (obstructive sleep apnea)   Fever   Tick bite   Tick fever   Fever -Had significant fever of 104.5 in the ED, blood cultures obtained. -Recent history of tick bite was about 3-4 weeks ago according to his wife.  -She also had noted a significant area of erythema afterwards.  -In the setting of fever, chills and tachycardia would go ahead and start on antibiotics  -Cultures have been drawn as well as titers, clear chest x-ray and urine. -Started on doxycycline   Diarrhea -Recently went to the mountains and reported drinking well water.  -No one else was sick but they all drank bottled water  -Will start on metronidazole for possible parasitic infection, stool culture and studies for ova and parasites sent.   OSA -Started back on his CPAP.   Code Status: Full code Family Communication: Plan discussed with the patient. Disposition Plan: Remains inpatient   Consultants:  ID  Procedures:  None  Antibiotics:  Doxycycline   Objective: Filed Vitals:   07/04/14 0452  BP: 103/54  Pulse: 60  Temp: 98.1 F (36.7 C)  Resp: 16    Intake/Output Summary (Last 24 hours) at 07/04/14 1127 Last data filed at 07/04/14 0700  Gross per 24 hour  Intake 1203.33 ml  Output      1 ml  Net 1202.33 ml   Filed Weights   07/03/14 2118  Weight: 103.6 kg (228 lb 6.3 oz)    Exam: General: Alert and awake, oriented x3, not in any acute distress. HEENT: anicteric sclera, pupils reactive to light and accommodation, EOMI CVS: S1-S2 clear, no murmur rubs or gallops Chest: clear to auscultation bilaterally, no wheezing, rales or rhonchi Abdomen: soft nontender, nondistended, normal bowel sounds, no  organomegaly Extremities: no cyanosis, clubbing or edema noted bilaterally Neuro: Cranial nerves II-XII intact, no focal neurological deficits  Data Reviewed: Basic Metabolic Panel:  Recent Labs Lab 07/03/14 1830 07/03/14 2140 07/04/14 0430  NA 136*  --  140  K 4.1  --  4.1  CL 98  --  107  CO2 25  --  24  GLUCOSE 90  --  107*  BUN 15  --  11  CREATININE 0.98 0.87 0.85  CALCIUM 9.9  --  8.4   Liver Function Tests:  Recent Labs Lab 07/03/14 1830 07/04/14 0430  AST 37 28  ALT 58* 51  ALKPHOS 72 59  BILITOT 0.3 0.4  PROT 7.3 5.7*  ALBUMIN 4.0 3.0*   No results found for this basename: LIPASE, AMYLASE,  in the last 168 hours No results found for this basename: AMMONIA,  in the last 168 hours CBC:  Recent Labs Lab 07/03/14 1830 07/03/14 2140 07/04/14 0430  WBC 13.3* 12.0* 11.7*  NEUTROABS 11.5*  --   --   HGB 15.6 13.9 13.0  HCT 45.6 40.6 38.8*  MCV 91.8 90.6 91.7  PLT 165 145* 132*   Cardiac Enzymes: No results found for this basename: CKTOTAL, CKMB, CKMBINDEX, TROPONINI,  in the last 168 hours BNP (last 3 results) No results found for this basename: PROBNP,  in the last 8760 hours CBG: No results found for this basename: GLUCAP,  in the last 168 hours  Micro No results found for this or any previous visit (from the past  240 hour(s)).   Studies: Dg Chest 2 View  07/03/2014   CLINICAL DATA:  Fever, chills, weakness.  EXAM: CHEST  2 VIEW  COMPARISON:  05/31/2014  FINDINGS: Low lung volumes with minimal bibasilar atelectasis. Heart is normal size. No effusions. No acute bony abnormality.  IMPRESSION: Low lung volumes, bibasilar atelectasis.   Electronically Signed   By: Charlett NoseKevin  Dover M.D.   On: 07/03/2014 18:19    Scheduled Meds: . Armodafinil  150 mg Oral Daily  . atorvastatin  40 mg Oral q1800  . diclofenac  75 mg Oral BID  . docusate sodium  100 mg Oral BID  . doxycycline (VIBRAMYCIN) IV  100 mg Intravenous Q12H  . folic acid  1 mg Oral Daily  .  metroNIDAZOLE  500 mg Oral 4 times per day  . multivitamin with minerals  1 tablet Oral Daily  . sertraline  100 mg Oral Daily  . sodium chloride  3 mL Intravenous Q12H  . thiamine  100 mg Oral Daily   Continuous Infusions: . sodium chloride 100 mL/hr at 07/03/14 2234       Time spent: 35 minutes    [facility] CenterELMAHI,Cammi Consalvo A  [facility] Pager [phone] If 7PM-7AM, please contact night-coverage at [email or web address], password [email or web address] 07/04/2014, 11:27 AM  LOS: 1 day

## 2014-07-04 NOTE — Progress Notes (Signed)
Pt placed on Auto CPAP 10-14 CMH20 with 2 LPM O2 bleed in via nasal mask.  Pt tolerating well at this time, RT to monitor and assess as needed.

## 2014-07-04 NOTE — Consult Note (Signed)
    [facility]     Reason for Consult: febrile illness    Referring Physician: Dr. Arthor CaptainElmahi  Active Problems:   OSA (obstructive sleep apnea)   Fever   Tick bite   Tick fever   . Armodafinil  150 mg Oral Daily  . atorvastatin  40 mg Oral q1800  . diclofenac  75 mg Oral BID  . docusate sodium  100 mg Oral BID  . doxycycline (VIBRAMYCIN) IV  100 mg Intravenous Q12H  . folic acid  1 mg Oral Daily  . metroNIDAZOLE  500 mg Oral 4 times per day  . multivitamin with minerals  1 tablet Oral Daily  . sertraline  100 mg Oral Daily  . sodium chloride  3 mL Intravenous Q12H  . thiamine  100 mg Oral Daily    Recommendations: Continue doxycycline 100 mg bid for 5 days total Supportive care  HIV and hepatitis C screen per CDC guidelines  Assessment: He has malaise, myalgias, fever, diarrhea c/w viral illness.  Only about 3-4 loose stools yesterday, though campylobacter and other organisms possible but unlikely.  RMSF always possible but is well out of the incubation period of his known tick bites or exposures.     Antibiotics: Doxy flagyl  HPI: Russell Hayden is a 53 y.o. male with anxiety, depression, history of thrombocytopenia, OSA who noted several days of malaise, chills with fever, diarrhea.  Came in to ED and admitted with concern for tick related illness.  Last known tick bite was in April, found a tick 3 weeks ago on leg but not a bite.  No sick contacts.  No rash.  Had a rash around the tick bite area in April.     Review of Systems: A comprehensive review of systems was negative.  Past Medical History  Diagnosis Date  . Hyperlipidemia   . IBS (irritable bowel syndrome)   . Anxiety   . Depression   . Chronic low back pain   . Allergic rhinitis   . Idiopathic thrombocytopenia   . OSA (obstructive sleep apnea)     does use a cpap    History  Substance Use Topics  . Smoking status: Never Smoker   . Smokeless tobacco: Not on file  .  Alcohol Use: Yes     Comment: occ    Family History  Problem Relation Age of Onset  . Hyperlipidemia Mother   . Hypertension Mother   . Celiac disease Mother   . Hypertension Brother   . Celiac disease Brother   . Leukemia Father   . Skin cancer Father   . Cancer Paternal Grandmother   . Asthma Mother   . Allergies Mother    Allergies  Allergen Reactions  . Penicillins     Itching and rash    OBJECTIVE: Blood pressure 108/58, pulse 72, temperature 98.4 F (36.9 C), temperature source Oral, resp. rate 18, height 5\' 9"  (1.753 m), weight 228 lb 6.3 oz (103.6 kg), SpO2 95.00%. General: awkae, alert, on nasal cpap Skin: no rashes Lungs: CTA B Cor: RRR Abdomen: soft, nt, nd Ext: no edema  Microbiology: No results found for this or any previous visit (from the past 240 hour(s)).  Staci RighterOMER, Russell Lina, MD [facility] [email or web address] [phone] pager  [phone] cell 07/04/2014, 1:36 PM

## 2014-07-04 NOTE — Progress Notes (Signed)
Pt already on CPAP when RT checked on him.  Current settings are Auto CPAP 10-14 CMH20 with 2 LPM O2 bleed in.  Pt tolerating well at this time, RT to monitor and assess as needed.

## 2014-07-05 DIAGNOSIS — R6889 Other general symptoms and signs: Secondary | ICD-10-CM

## 2014-07-05 LAB — HEPATITIS C ANTIBODY (REFLEX): HCV Ab: NEGATIVE

## 2014-07-05 LAB — HIV ANTIBODY (ROUTINE TESTING W REFLEX): HIV: NONREACTIVE

## 2014-07-05 MED ORDER — ROSUVASTATIN CALCIUM 40 MG PO TABS: 40.0000 mg | ORAL_TABLET | Freq: Every day | ORAL | Status: DC

## 2014-07-05 MED ORDER — DOXYCYCLINE HYCLATE 100 MG PO TABS: 100.0000 mg | ORAL_TABLET | Freq: Two times a day (BID) | ORAL | Status: DC

## 2014-07-05 NOTE — Discharge Summary (Signed)
Physician Discharge Summary  Russell Hayden [id number] DOB: 06/28/1961 DOA: 07/03/2014  PCP: Londell Moh, MD  Admit date: 07/03/2014 Discharge date: 07/05/2014  Time spent: 40 minutes  Recommendations for Outpatient Follow-up:  1. Followup with PCP within 2 weeks  Discharge Diagnoses:  Active Problems:   OSA (obstructive sleep apnea)   Fever   Tick bite   Tick fever   Discharge Condition: Stable  Diet recommendation: Regular  Filed Weights   07/03/14 2118  Weight: 103.6 kg (228 lb 6.3 oz)    History of present illness:  Russell Hayden is a 53 y.o. male who states that about 3 weeks ago had a tick on his body. Patients wife removed the tick .She states that later on she noted a red erythematous lesion on his back. Patient was recently in the mountains also and was drinking well water,. He also admits to having diarrhea. Patient today developed sudden onset of fevers to 104 and had severe chills noted. He states that he had been having body aches also. Patient felt extremely weak. He was not disoriented. He had a headache but denies having any stiffness in his neck. Patient states he also had diarrhea which was rather severe and explosive. Patient came into the ED and was noted to be tachycardic and so is being admitted for possible RMSF.  Hospital Course:   Fever  -Had significant fever of 104.5 in the ED, blood cultures obtained.  -Recent history of tick bite was about 3-4 weeks ago according to his wife.  -She also had noted a significant area of erythema afterwards.  -Patient started on doxycycline empirically, blood culture and EGD. -Infectious disease, Dr. Luciana Axe was consulted and recommended to and continue doxycycline for 5 days. -Per infectious disease his fever is likely secondary to transient viral illness.  Diarrhea  -Recently went to the mountains and reported drinking well water.  -No one else was sick but they all drank bottled water  -Will start on  metronidazole for possible parasitic infection, stool culture and studies for ova and parasites sent.  -ID recommended to discontinue the Flagyl on discharge.  OSA  -His CPAP was started during the hospital stay.  Viral illness -As mentioned above after ID saw the patient, decided that his fever and other symptoms including diarrhea is likely secondary to transient viral illness. -The patient was not very clear about what time the tick bit him, he was saying about 2 months and his wife was saying about 3-4 weeks.   Procedures:  None  Consultations:  ID  Discharge Exam: Filed Vitals:   07/05/14 1411  BP: 131/73  Pulse: 72  Temp: 98.3 F (36.8 C)  Resp: 18   General: Alert and awake, oriented x3, not in any acute distress. HEENT: anicteric sclera, pupils reactive to light and accommodation, EOMI CVS: S1-S2 clear, no murmur rubs or gallops Chest: clear to auscultation bilaterally, no wheezing, rales or rhonchi Abdomen: soft nontender, nondistended, normal bowel sounds, no organomegaly Extremities: no cyanosis, clubbing or edema noted bilaterally Neuro: Cranial nerves II-XII intact, no focal neurological deficits  Discharge Instructions You were cared for by a hospitalist during your hospital stay. If you have any questions about your discharge medications or the care you received while you were in the hospital after you are discharged, you can call the unit and asked to speak with the hospitalist on call if the hospitalist that took care of you is not available. Once you are discharged, your primary care physician  will handle any further medical issues. Please note that NO REFILLS for any discharge medications will be authorized once you are discharged, as it is imperative that you return to your primary care physician (or establish a relationship with a primary care physician if you do not have one) for your aftercare needs so that they can reassess your need for medications and  monitor your lab values.     Medication List         acetaminophen 500 MG tablet  Commonly known as:  TYLENOL  Take 1,000 mg by mouth every 6 (six) hours as needed for mild pain.     diclofenac 75 MG EC tablet  Commonly known as:  VOLTAREN  Take 75 mg by mouth 2 (two) times daily.     doxycycline 100 MG tablet  Commonly known as:  VIBRA-TABS  Take 1 tablet (100 mg total) by mouth 2 (two) times daily.     NUVIGIL 150 MG tablet  Generic drug:  Armodafinil  Take 150 mg by mouth daily.     rosuvastatin 20 MG tablet  Commonly known as:  CRESTOR  Take 40 mg by mouth daily.     sertraline 100 MG tablet  Commonly known as:  ZOLOFT  Take 100 mg by mouth daily.       Allergies  Allergen Reactions  . Penicillins     Itching and rash       Follow-up Information   Follow up with Londell Moh, MD In 2 weeks.   Specialty:  Internal Medicine   Contact information:   [address] [place] [address] [phone]        The results of significant diagnostics from this hospitalization (including imaging, microbiology, ancillary and laboratory) are listed below for reference.    Significant Diagnostic Studies: Dg Chest 2 View  07/03/2014   CLINICAL DATA:  Fever, chills, weakness.  EXAM: CHEST  2 VIEW  COMPARISON:  05/31/2014  FINDINGS: Low lung volumes with minimal bibasilar atelectasis. Heart is normal size. No effusions. No acute bony abnormality.  IMPRESSION: Low lung volumes, bibasilar atelectasis.   Electronically Signed   By: Charlett Nose M.D.   On: 07/03/2014 18:19    Microbiology: Recent Results (from the past 240 hour(s))  CULTURE, BLOOD (ROUTINE X 2)     Status: None   Collection Time    07/03/14  7:36 PM      Result Value Ref Range Status   Specimen Description BLOOD   Final   Special Requests BOTTLES DRAWN AEROBIC AND ANAEROBIC 3 ML   Final   Culture  Setup Time     Final   Value: 07/04/2014 01:07     Performed at [facility]   Culture     Final   Value:        BLOOD CULTURE RECEIVED NO GROWTH TO DATE CULTURE WILL BE HELD FOR 5 DAYS BEFORE ISSUING A FINAL NEGATIVE REPORT     Performed at [facility]   Report Status PENDING   Incomplete  CULTURE, BLOOD (ROUTINE X 2)     Status: None   Collection Time    07/03/14  7:39 PM      Result Value Ref Range Status   Specimen Description BLOOD   Final   Special Requests BOTTLES DRAWN AEROBIC AND ANAEROBIC 8 ML   Final   Culture  Setup Time     Final   Value: 07/04/2014 01:07  [facility]formed [facility]   Culture     Final   Value:        BLOOD CULTURE RECEIVED NO GROWTH TO DATE CULTURE WILL BE HELD FOR 5 DAYS BEFORE ISSUING A FINAL NEGATIVE REPORT     Performed at [facility]   Report Status PENDING   Incomplete  STOOL CULTURE     Status: None   Collection Time    07/04/14  9:24 AM      Result Value Ref Range Status   Specimen Description STOOL   Final   Special Requests Normal   Final   Culture     Final   Value: Culture reincubated for better growth     [facility]   Report Status PENDING   Incomplete     Labs: Basic Metabolic Panel:  Recent Labs Lab 07/03/14 1830 07/03/14 2140 07/04/14 0430  NA 136*  --  140  K 4.1  --  4.1  CL 98  --  107  CO2 25  --  24  GLUCOSE 90  --  107*  BUN 15  --  11  CREATININE 0.98 0.87 0.85  CALCIUM 9.9  --  8.4   Liver Function Tests:  Recent Labs Lab 07/03/14 1830 07/04/14 0430  AST 37 28  ALT 58* 51  ALKPHOS 72 59  BILITOT 0.3 0.4  PROT 7.3 5.7*  ALBUMIN 4.0 3.0*   No results found for this basename: LIPASE, AMYLASE,  in the last 168 hours No results found for this basename: AMMONIA,  in the last 168 hours CBC:  Recent Labs Lab 07/03/14 1830 07/03/14 2140 07/04/14 0430  WBC 13.3* 12.0* 11.7*  NEUTROABS 11.5*  --   --   HGB 15.6 13.9 13.0  HCT 45.6 40.6 38.8*  MCV 91.8 90.6 91.7  PLT 165 145* 132*   Cardiac Enzymes: No results  found for this basename: CKTOTAL, CKMB, CKMBINDEX, TROPONINI,  in the last 168 hours BNP: BNP (last 3 results) No results found for this basename: PROBNP,  in the last 8760 hours CBG: No results found for this basename: GLUCAP,  in the last 168 hours     Signed:  Maryfer Tauzin A  [facility] 07/05/2014, 3:16 PM

## 2014-07-06 LAB — OVA AND PARASITE EXAMINATION
Ova and parasites: NONE SEEN
SPECIAL REQUESTS: NORMAL

## 2014-07-07 LAB — EHRLICHIA ANTIBODY PANEL

## 2014-07-08 LAB — STOOL CULTURE: Special Requests: NORMAL

## 2014-07-10 LAB — CULTURE, BLOOD (ROUTINE X 2)
Culture: NO GROWTH
Culture: NO GROWTH

## 2015-03-30 ENCOUNTER — Encounter (HOSPITAL_COMMUNITY): Payer: Self-pay | Admitting: *Deleted

## 2015-03-30 ENCOUNTER — Emergency Department (HOSPITAL_COMMUNITY)
Admission: EM | Admit: 2015-03-30 | Discharge: 2015-03-30 | Disposition: A | Discharge status: Home / Self Care | Payer: BLUE CROSS/BLUE SHIELD | Source: Home / Self Care | Attending: Family Medicine | Admitting: Family Medicine

## 2015-03-30 DIAGNOSIS — J0100 Acute maxillary sinusitis, unspecified: Secondary | ICD-10-CM | POA: Diagnosis not present

## 2015-03-30 MED ORDER — AZITHROMYCIN 250 MG PO TABS: 250.0000 mg | ORAL_TABLET | Freq: Every day | ORAL | Status: DC

## 2015-03-30 NOTE — ED Notes (Addendum)
C/o horrible headaches, severe congestion, green mucous in head and chest, incessant coughing and no energy onset 3/21.  No chills or fever.  Gums in upper jaw are numb. Gets it every year the last week of March. Usually gets a Z-pack for it.

## 2015-03-30 NOTE — ED Provider Notes (Signed)
CSN: [id number]     Arrival date & time 03/30/15  1820 History   None    Chief Complaint  Patient presents with  . Facial Pain   (Consider location/radiation/quality/duration/timing/severity/associated sxs/prior Treatment) HPI Comments: Patient presents with a 7 day history of sinus pressure, teeth pain, nasal congestion that is green and would like antibiotics, because he reports "they always work for him". He denies a cough. No fevers.   The history is provided by the patient.    Past Medical History  Diagnosis Date  . Hyperlipidemia   . IBS (irritable bowel syndrome)   . Anxiety   . Depression   . Chronic low back pain   . Allergic rhinitis   . Idiopathic thrombocytopenia   . OSA (obstructive sleep apnea)     does use a cpap   Past Surgical History  Procedure Laterality Date  . Laminectomy      approx 2000  . Nasal sinus surgery      approx late 1990s.  deviated septum  . Spine surgery    . Tonsillectomy    . Colonoscopy    . Knee arthroscopy  02/01/2013    Procedure: ARTHROSCOPY KNEE;  Surgeon: Nestor LewandowskyFrank J Rowan, MD;  Location: [facility];  Service: Orthopedics;  Laterality: Left;  . Shoulder arthroscopy Left 04/09/2014    Procedure: LEFT ARTHROSCOPY SHOULDER, DISTAL CLAVICLE EXCISION, ACROMIOPLASTY;  Surgeon: Nestor LewandowskyFrank J Rowan, MD;  Location: [facility];  Service: Orthopedics;  Laterality: Left;  . Knee arthroscopy Left 04/09/2014    Procedure: LEFT ARTHROSCOPY KNEE, CHONDROPLASTY;  Surgeon: Nestor LewandowskyFrank J Rowan, MD;  Location: [facility];  Service: Orthopedics;  Laterality: Left;   Family History  Problem Relation Age of Onset  . Hyperlipidemia Mother   . Hypertension Mother   . Celiac disease Mother   . Asthma Mother   . Allergies Mother   . Hypertension Brother   . Celiac disease Brother   . Leukemia Father   . Skin cancer Father   . Cancer Paternal Grandmother    History  Substance Use Topics  . Smoking status: Never Smoker    . Smokeless tobacco: Not on file  . Alcohol Use: Yes     Comment: occ    Review of Systems  All other systems reviewed and are negative.   Allergies  Penicillins  Home Medications   Prior to Admission medications   Medication Sig Start Date End Date Taking? Authorizing Provider  acetaminophen (TYLENOL) 500 MG tablet Take 1,000 mg by mouth every 6 (six) hours as needed for mild pain.   Yes Historical Provider, MD  Armodafinil (NUVIGIL) 150 MG tablet Take 150 mg by mouth daily.     Yes Historical Provider, MD  rosuvastatin (CRESTOR) 20 MG tablet Take 40 mg by mouth daily.    Yes Historical Provider, MD  sertraline (ZOLOFT) 100 MG tablet Take 100 mg by mouth daily.     Yes Historical Provider, MD  azithromycin (ZITHROMAX) 250 MG tablet Take 1 tablet (250 mg total) by mouth daily. Take first 2 tablets together, then 1 every day until finished. 03/30/15   Riki SheerMichelle G Young, PA-C  diclofenac (VOLTAREN) 75 MG EC tablet Take 75 mg by mouth 2 (two) times daily.    Historical Provider, MD  doxycycline (VIBRA-TABS) 100 MG tablet Take 1 tablet (100 mg total) by mouth 2 (two) times daily. 07/05/14   Mutaz Elmahi, MD   BP 125/76 mmHg  Pulse 70  Temp(Src) 98.4  F (36.9 C) (Oral)  Resp 18  SpO2 96% Physical Exam  Constitutional: He is oriented to person, place, and time. He appears well-developed and well-nourished. No distress.  HENT:  Head: Normocephalic and atraumatic.  Right Ear: External ear normal.  Left Ear: External ear normal.  Mouth/Throat: Oropharynx is clear and moist.  Maxillary pain to palpation, nasal turbinate swelling and erythema  Neck: Normal range of motion.  Cardiovascular: Normal rate and regular rhythm.   Pulmonary/Chest: Effort normal and breath sounds normal. No respiratory distress. He has no wheezes.  Lymphadenopathy:    He has no cervical adenopathy.  Neurological: He is alert and oriented to person, place, and time.  Skin: Skin is warm and dry. He is not  diaphoretic.  Psychiatric: His behavior is normal.  Nursing note and vitals reviewed.   ED Course  Procedures (including critical care time) Labs Review Labs Reviewed - No data to display  Imaging Review No results found.   MDM   1. Subacute maxillary sinusitis    Advised Z-max not usually given for sinus; he reports that these work for him. Ok to give. Allergic to PCN. Symptomatic care also with fluids.     Riki Sheer, PA-C 03/30/15 [phone]

## 2015-03-30 NOTE — Discharge Instructions (Signed)
Sinusitis °Sinusitis is redness, soreness, and puffiness (inflammation) of the air pockets in the bones of your face (sinuses). The redness, soreness, and puffiness can cause air and mucus to get trapped in your sinuses. This can allow germs to grow and cause an infection.  °HOME CARE  °· Drink enough fluids to keep your pee (urine) clear or pale yellow. °· Use a humidifier in your home. °· Run a hot shower to create steam in the bathroom. Sit in the bathroom with the door closed. Breathe in the steam 3-4 times a day. °· Put a warm, moist washcloth on your face 3-4 times a day, or as told by your doctor. °· Use salt water sprays (saline sprays) to wet the thick fluid in your nose. This can help the sinuses drain. °· Only take medicine as told by your doctor. °GET HELP RIGHT AWAY IF:  °· Your pain gets worse. °· You have very bad headaches. °· You are sick to your stomach (nauseous). °· You throw up (vomit). °· You are very sleepy (drowsy) all the time. °· Your face is puffy (swollen). °· Your vision changes. °· You have a stiff neck. °· You have trouble breathing. °MAKE SURE YOU:  °· Understand these instructions. °· Will watch your condition. °· Will get help right away if you are not doing well or get worse. °Document Released: 06/01/2008 Document Revised: 09/07/2012 Document Reviewed: 07/19/2012 °ExitCare® Patient Information ©2015 [employer]. This information is not intended to replace advice given to you by your health care provider. Make sure you discuss any questions you have with your health care provider. ° °

## 2015-10-21 ENCOUNTER — Emergency Department (HOSPITAL_COMMUNITY)
Admission: EM | Admit: 2015-10-21 | Discharge: 2015-10-21 | Disposition: A | Discharge status: Home / Self Care | Payer: BLUE CROSS/BLUE SHIELD | Attending: Emergency Medicine | Admitting: Emergency Medicine

## 2015-10-21 ENCOUNTER — Encounter (HOSPITAL_COMMUNITY): Payer: Self-pay | Admitting: Emergency Medicine

## 2015-10-21 DIAGNOSIS — G8929 Other chronic pain: Secondary | ICD-10-CM | POA: Diagnosis not present

## 2015-10-21 DIAGNOSIS — F419 Anxiety disorder, unspecified: Secondary | ICD-10-CM | POA: Insufficient documentation

## 2015-10-21 DIAGNOSIS — Z8719 Personal history of other diseases of the digestive system: Secondary | ICD-10-CM | POA: Insufficient documentation

## 2015-10-21 DIAGNOSIS — G4733 Obstructive sleep apnea (adult) (pediatric): Secondary | ICD-10-CM | POA: Diagnosis not present

## 2015-10-21 DIAGNOSIS — S41112A Laceration without foreign body of left upper arm, initial encounter: Secondary | ICD-10-CM

## 2015-10-21 DIAGNOSIS — Y9389 Activity, other specified: Secondary | ICD-10-CM | POA: Insufficient documentation

## 2015-10-21 DIAGNOSIS — S51812A Laceration without foreign body of left forearm, initial encounter: Secondary | ICD-10-CM | POA: Diagnosis not present

## 2015-10-21 DIAGNOSIS — Z880 Allergy status to penicillin: Secondary | ICD-10-CM | POA: Diagnosis not present

## 2015-10-21 DIAGNOSIS — Z862 Personal history of diseases of the blood and blood-forming organs and certain disorders involving the immune mechanism: Secondary | ICD-10-CM | POA: Insufficient documentation

## 2015-10-21 DIAGNOSIS — Z79899 Other long term (current) drug therapy: Secondary | ICD-10-CM | POA: Diagnosis not present

## 2015-10-21 DIAGNOSIS — W270XXA Contact with workbench tool, initial encounter: Secondary | ICD-10-CM | POA: Diagnosis not present

## 2015-10-21 DIAGNOSIS — Y9289 Other specified places as the place of occurrence of the external cause: Secondary | ICD-10-CM | POA: Insufficient documentation

## 2015-10-21 DIAGNOSIS — E785 Hyperlipidemia, unspecified: Secondary | ICD-10-CM | POA: Diagnosis not present

## 2015-10-21 DIAGNOSIS — Z791 Long term (current) use of non-steroidal anti-inflammatories (NSAID): Secondary | ICD-10-CM | POA: Diagnosis not present

## 2015-10-21 DIAGNOSIS — Z9981 Dependence on supplemental oxygen: Secondary | ICD-10-CM | POA: Diagnosis not present

## 2015-10-21 DIAGNOSIS — Z792 Long term (current) use of antibiotics: Secondary | ICD-10-CM | POA: Insufficient documentation

## 2015-10-21 DIAGNOSIS — F329 Major depressive disorder, single episode, unspecified: Secondary | ICD-10-CM | POA: Diagnosis not present

## 2015-10-21 DIAGNOSIS — Y998 Other external cause status: Secondary | ICD-10-CM | POA: Diagnosis not present

## 2015-10-21 DIAGNOSIS — Z23 Encounter for immunization: Secondary | ICD-10-CM | POA: Insufficient documentation

## 2015-10-21 DIAGNOSIS — S6992XA Unspecified injury of left wrist, hand and finger(s), initial encounter: Secondary | ICD-10-CM | POA: Diagnosis present

## 2015-10-21 MED ORDER — TETANUS-DIPHTH-ACELL PERTUSSIS 5-2.5-18.5 LF-MCG/0.5 IM SUSP
0.5000 mL | Freq: Once | INTRAMUSCULAR | Status: AC
  Administered 2015-10-21: 0.5 mL via INTRAMUSCULAR
  Filled 2015-10-21: qty 0.5

## 2015-10-21 MED ORDER — LIDOCAINE-EPINEPHRINE 2 %-1:100000 IJ SOLN
20.0000 mL | Freq: Once | INTRAMUSCULAR | Status: AC
  Administered 2015-10-21: 20 mL
  Filled 2015-10-21: qty 1

## 2015-10-21 MED ORDER — BACITRACIN ZINC 500 UNIT/GM EX OINT: 1.0000 "application " | TOPICAL_OINTMENT | Freq: Two times a day (BID) | CUTANEOUS | Status: DC

## 2015-10-21 NOTE — ED Notes (Signed)
Approximately 1.5" Lac to Left forearm from hand saw.

## 2015-10-21 NOTE — ED Provider Notes (Signed)
CSN: [id number]     Arrival date & time 10/21/15  2053 History  By signing my name below, I, Sonum Patel, attest that this documentation has been prepared under the direction and in the presence of [employer], PA-C. Electronically Signed: Sonum Patel, Neurosurgeon. 10/21/2015. 9:39 PM.    Chief Complaint  Patient presents with  . Laceration   The history is provided by the patient. No language interpreter was used.     HPI Comments: Russell Hayden is a 54 y.o. male who presents to the Emergency Department complaining of a laceration to the left forearm that occurred PTA. Patient states he was using a hand saw when the injury occurred. He denies anti-coagulant use. He denies associated numbness, weakness. He cannot recall the date of his last tetanus shot. No medications taken prior to arrival.  Past Medical History  Diagnosis Date  . Hyperlipidemia   . IBS (irritable bowel syndrome)   . Anxiety   . Depression   . Chronic low back pain   . Allergic rhinitis   . Idiopathic thrombocytopenia (HCC)   . OSA (obstructive sleep apnea)     does use a cpap   Past Surgical History  Procedure Laterality Date  . Laminectomy      approx 2000  . Nasal sinus surgery      approx late 1990s.  deviated septum  . Spine surgery    . Tonsillectomy    . Colonoscopy    . Knee arthroscopy  02/01/2013    Procedure: ARTHROSCOPY KNEE;  Surgeon: Nestor Lewandowsky, MD;  Location: [facility];  Service: Orthopedics;  Laterality: Left;  . Shoulder arthroscopy Left 04/09/2014    Procedure: LEFT ARTHROSCOPY SHOULDER, DISTAL CLAVICLE EXCISION, ACROMIOPLASTY;  Surgeon: Nestor Lewandowsky, MD;  Location: [facility];  Service: Orthopedics;  Laterality: Left;  . Knee arthroscopy Left 04/09/2014    Procedure: LEFT ARTHROSCOPY KNEE, CHONDROPLASTY;  Surgeon: Nestor Lewandowsky, MD;  Location: [facility];  Service: Orthopedics;  Laterality: Left;   Family History  Problem Relation Age of Onset   . Hyperlipidemia Mother   . Hypertension Mother   . Celiac disease Mother   . Asthma Mother   . Allergies Mother   . Hypertension Brother   . Celiac disease Brother   . Leukemia Father   . Skin cancer Father   . Cancer Paternal Grandmother    Social History  Substance Use Topics  . Smoking status: Never Smoker   . Smokeless tobacco: None  . Alcohol Use: Yes     Comment: occ    Review of Systems  Skin: Positive for wound (laceration).  Neurological: Negative for weakness and numbness.  All other systems reviewed and are negative.   Allergies  Penicillins  Home Medications   Prior to Admission medications   Medication Sig Start Date End Date Taking? Authorizing Provider  acetaminophen (TYLENOL) 500 MG tablet Take 1,000 mg by mouth every 6 (six) hours as needed for mild pain.    Historical Provider, MD  Armodafinil (NUVIGIL) 150 MG tablet Take 150 mg by mouth daily.      Historical Provider, MD  azithromycin (ZITHROMAX) 250 MG tablet Take 1 tablet (250 mg total) by mouth daily. Take first 2 tablets together, then 1 every day until finished. 03/30/15   Riki Sheer, PA-C  bacitracin ointment Apply 1 application topically 2 (two) times daily. 10/21/15   Antony Madura, PA-C  diclofenac (VOLTAREN) 75 MG EC tablet  Take 75 mg by mouth 2 (two) times daily.    Historical Provider, MD  doxycycline (VIBRA-TABS) 100 MG tablet Take 1 tablet (100 mg total) by mouth 2 (two) times daily. 07/05/14   Clydia LlanoMutaz Elmahi, MD  rosuvastatin (CRESTOR) 20 MG tablet Take 40 mg by mouth daily.     Historical Provider, MD  sertraline (ZOLOFT) 100 MG tablet Take 100 mg by mouth daily.      Historical Provider, MD   BP 117/76 mmHg  Pulse 68  Temp(Src) 98.4 F (36.9 C) (Oral)  Resp 16  SpO2 98%   Physical Exam  Constitutional: He is oriented to person, place, and time. He appears well-developed and well-nourished. No distress.  HENT:  Head: Normocephalic and atraumatic.  Eyes: Conjunctivae and EOM are  normal. No scleral icterus.  Neck: Normal range of motion.  Cardiovascular: Normal rate, regular rhythm and intact distal pulses.   Distal radial pulse 2+ in the left upper extremity. Capillary refill brisk in all digits of left hand.  Pulmonary/Chest: Effort normal. No respiratory distress.  Musculoskeletal: Normal range of motion.       Left forearm: He exhibits laceration. He exhibits no bony tenderness, no swelling, no edema and no deformity.       Arms: Neurological: He is alert and oriented to person, place, and time. He exhibits normal muscle tone. Coordination normal.  Sensation to light touch intact in the left upper extremity.  Skin: Skin is warm and dry. No rash noted. He is not diaphoretic. No erythema. No pallor.  4cm laceration to the volar left forearm with exposed subcutaneous fat. Bleeding controlled.  Psychiatric: He has a normal mood and affect. His behavior is normal.  Nursing note and vitals reviewed.   ED Course  Procedures (including critical care time)  DIAGNOSTIC STUDIES: Oxygen Saturation is 97% on RA, adequate by my interpretation.    COORDINATION OF CARE: 9:41 PM Discussed treatment plan with pt at bedside and pt agreed to plan.  10:29 PM LACERATION REPAIR PROCEDURE NOTE The patient's identification was confirmed and consent was obtained. This procedure was performed by Antony MaduraKelly Kasch Borquez, PA-C at 10:29 PM. Site: volar forearm, left Sterile procedures observed Anesthetic used (type and amt): 2% lidocaine with epi, 10 cc  Suture type/size:4-0 Ethilon  Length: 4 cm  # of Sutures: 7 Technique:simple interrupted Complexity: simple Antibx ointment applied Tetanus ordered Site anesthetized, irrigated with NS, explored without evidence of foreign body, wound well approximated, site covered with dry, sterile dressing.  Patient tolerated procedure well without complications. Instructions for care discussed verbally and patient provided with additional written  instructions for homecare and f/u.    Labs Review Labs Reviewed - No data to display  Imaging Review No results found.    EKG Interpretation None      MDM   Final diagnoses:  Laceration of arm, left, initial encounter    Tdap booster given. Pressure irrigation performed. Laceration occurred < 8 hours prior to repair which was well tolerated. Pt has no comorbidities to effect normal wound healing. Discussed suture home care with patient and answered questions. Pt to follow up for wound check and suture removal in 10 days. Pt is hemodynamically stable with no complaints prior to discharge.    I personally performed the services described in this documentation, which was scribed in my presence. The recorded information has been reviewed and is accurate.    Filed Vitals:   10/21/15 2102 10/21/15 2235  BP: 141/76 117/76  Pulse: 73 68  Temp: 98.4 F (36.9 C)   TempSrc: Oral   Resp: 20 16  SpO2: 97% 98%     Antony Madura, PA-C 10/21/15 2311  Bethann Berkshire, MD 10/24/15 1520

## 2015-10-21 NOTE — Discharge Instructions (Signed)

## 2016-06-28 DIAGNOSIS — R509 Fever, unspecified: Secondary | ICD-10-CM | POA: Diagnosis not present

## 2017-04-05 ENCOUNTER — Ambulatory Visit (INDEPENDENT_AMBULATORY_CARE_PROVIDER_SITE_OTHER): Payer: BLUE CROSS/BLUE SHIELD | Admitting: Physician Assistant

## 2017-04-05 ENCOUNTER — Ambulatory Visit (INDEPENDENT_AMBULATORY_CARE_PROVIDER_SITE_OTHER): Payer: BLUE CROSS/BLUE SHIELD

## 2017-04-05 VITALS — BP 144/76 | HR 79 | Resp 16

## 2017-04-05 DIAGNOSIS — S60221A Contusion of right hand, initial encounter: Secondary | ICD-10-CM

## 2017-04-05 DIAGNOSIS — M79641 Pain in right hand: Secondary | ICD-10-CM

## 2017-04-05 DIAGNOSIS — S6391XA Sprain of unspecified part of right wrist and hand, initial encounter: Secondary | ICD-10-CM

## 2017-04-05 NOTE — Patient Instructions (Addendum)
There is no acute fracture on your xray today, which is great news. I recommend icing it 4-5 x a day for at least 20 minutes at a time and using compression around the hand to help with swelling. Avoid excessive moving/lifting with this hand until swelling subsides. You can also use ibuprofen every 4-6 hours as needed for pain. If your pain persists after 7-10 days please return to clinic for further evaluation.   Intermetacarpal Sprain An intermetacarpal sprain happens when tissues between bones in the hand (metacarpals) become overstretched or torn(ruptured). This usually happens because of an injury to the hand. Intermetacarpal sprains range from mild to severe. They can take up to 2-12 weeks to heal, with proper treatment. What are the causes? This injury is caused by excess pressure or strain (stress) that is applied to the intermetacarpal ligaments. This often happens because of a hard, direct hit or injury (trauma) to the hand. What increases the risk? The following factors may make you more likely to develop this injury:  A previous hand injury.  Doing repetitive motions with your hands, such as movements in sports or heavy labor.  Having poor strength and flexibility in your hands. What are the signs or symptoms? Symptoms of this injury may include:  A feeling of popping or tearing inside the hand.  Pain and inflammation, especially in the knuckles.  Bruising.  Limited range of motion of the hand. How is this diagnosed? This injury is diagnosed based on a physical exam and your medical history. You may have X-rays to check for breaks (fractures) in your bones. Your sprain may be rated in degrees, based on how severe it is. The ratings include:  First-degree. A ligamentis stretched but it still has its normal shape.  Second-degree. A ligament is partially ruptured, and you may have some difficulty moving your hand normally.  Third-degree. A ligament is completely ruptured,  and you may not be able to move the affected hand. How is this treated? This injury is treated by resting, icing, raising (elevating), and applying pressure (compression) to the injured area. Depending on the severity of your sprain, treatment may also include:  Medicines that help to relieve pain.  Keeping your hand in a fixed position (immobilization) for a period of time. This may be done using a bandage (dressing), a cast, or a splint.  Exercises to strengthen and stretch your hand. You may be referred to a physical therapist.  Surgery. This is rare. Follow these instructions at home: If you have a cast:   Do not stick anything inside the cast to scratch your skin. Doing that increases your risk of infection.  Check the skin around the cast every day. Report any concerns to your health care provider. You may put lotion on dry skin around the edges of the cast. Do not apply lotion to the skin underneath the cast.  Do not let your cast get wet if it is not waterproof.  Keep the cast clean. If you have a splint:   Wear the splint as told by your health care provider. Remove it only as told by your health care provider.  Loosen the splint if your fingers tingle, become numb, or turn cold and blue.  Do not let your splint get wet if it is not waterproof.  Keep the splint clean. Bathing   If you have a cast, splint, or dressing, do not take baths, swim, or use a hot tub until your health care provider approves. Ask  your health care provider if you can take showers. You may only be allowed to take sponge baths for bathing.  If you have a cast or splint that is not waterproof, cover it with a watertight plastic bag when you take a bath or a shower. Managing pain, stiffness, and swelling   If directed, apply ice to the injured area:  Put ice in a plastic bag.  Place a towel between your skin and the bag.  Leave the ice on for 20 minutes, 2-3 times per day.  Move your fingers  often to avoid stiffness and to lessen swelling.  Elevate your hand above the level of your heart while you are sitting or lying down.  Wear a compression wrap only as told by your health care provider. Driving   Do not drive or operate heavy machinery while taking prescription pain medicine.  Ask your health care provider when it is safe to drive if you have a cast or splint on a hand that you use for driving. Activity   Return to your normal activities as told by your health care provider. Ask your health care provider what activities are safe for you.  Avoid activities that cause pain or make your condition worse.  Do exercises as told by your health care provider or physical therapist. General instructions   Take over-the-counter and prescription medicines only as told by your health care provider.  If you have a cast or a splint, do not put pressure on any part of the cast or splint until it is fully hardened. This may take several hours.  Do not wear rings on the fingers of your injured hand.  Keep all follow-up visits as told by your health care provider. This is important. Contact a health care provider if:  You have symptoms that do not get better after 2 weeks of treatment.  You have more redness, swelling, or pain in your injured area.  You have a fever.  Your cast or splint gets damaged. Get help right away if:  You have severe pain.  You develop numbness in your hand or fingers.  You cannot move your hand or fingers.  Your hand or fingers feel unusually cold.  Your hand or fingers turn blue.  Your fingernails turn a dark color, such as blue or gray. This information is not intended to replace advice given to you by your health care provider. Make sure you discuss any questions you have with your health care provider. Document Released: 12/14/2005 Document Revised: 05/21/2016 Document Reviewed: 06/03/2015 Elsevier Interactive Patient Education  2017  [employer].     IF you received an x-ray today, you will receive an invoice from [facility]. Please contact [facility] at [phone] with questions or concerns regarding your invoice.   IF you received labwork today, you will receive an invoice from [facility]. Please contact [facility] at [phone] with questions or concerns regarding your invoice.   Our billing staff will not be able to assist you with questions regarding bills from these companies.  You will be contacted with the lab results as soon as they are available. The fastest way to get your results is to activate your My Chart account. Instructions are located on the last page of this paperwork. If you have not heard from [place] regarding the results in 2 weeks, please contact this office.

## 2017-04-05 NOTE — Progress Notes (Signed)
Russell Hayden  MRN: [id number] DOB: Jun 27, 1961  Subjective:  Russell Hayden is a 56 y.o. male seen in office today for a chief complaint of right hand injury 3 hours prior to arrival. 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 Had immediate swelling and pain. Denies laceration, numbness, tingling, and decreased ROM. Has tried ice with some relief but has hx of scaphoid fracture so wanted to come get an xray.    Review of Systems  Constitutional: Negative for chills, diaphoresis and fever.   Patient Active Problem List   Diagnosis Date Noted  . Fever 07/03/2014  . Tick bite 07/03/2014  . Tick fever 07/03/2014  . Impingement syndrome of left shoulder 04/09/2014  . OSA (obstructive sleep apnea) 03/19/2011    Current Outpatient Prescriptions on File Prior to Visit  Medication Sig Dispense Refill  . Armodafinil (NUVIGIL) 150 MG tablet Take 150 mg by mouth daily.      . diclofenac (VOLTAREN) 75 MG EC tablet Take 75 mg by mouth 2 (two) times daily.    . rosuvastatin (CRESTOR) 20 MG tablet Take 40 mg by mouth daily.     . sertraline (ZOLOFT) 100 MG tablet Take 100 mg by mouth daily.      Marland Kitchen acetaminophen (TYLENOL) 500 MG tablet Take 1,000 mg by mouth every 6 (six) hours as needed for mild pain.    Marland Kitchen azithromycin (ZITHROMAX) 250 MG tablet Take 1 tablet (250 mg total) by mouth daily. Take first 2 tablets together, then 1 every day until finished. (Patient not taking: Reported on 04/05/2017) 6 tablet 0  . bacitracin ointment Apply 1 application topically 2 (two) times daily. (Patient not taking: Reported on 04/05/2017) 15 g 0  . doxycycline (VIBRA-TABS) 100 MG tablet Take 1 tablet (100 mg total) by mouth 2 (two) times daily. (Patient not taking: Reported on 04/05/2017) 10 tablet 0   No current facility-administered medications on file prior to visit.     Allergies  Allergen Reactions  . Penicillins     Itching and rash        Social History   Social History  . Marital status: Married    Spouse name: pt is married.  . Number of children: 2  . Years of education: N/A   Occupational History  . 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 Smoking status: Never Smoker  . Smokeless tobacco: Never Used  . Alcohol use Yes     Comment: occ  . Drug use: No  . Sexual activity: Not on file   Other Topics Concern  . Not on file   Social History Narrative   Pt has 2 sons.    Born in [place].          Objective:  BP (!) 144/76 (Cuff Size: Large)   Pulse 79   Resp 16   SpO2 98%   Physical Exam  Constitutional: He is oriented to person, place, and time and well-developed, well-nourished, and in no distress.  HENT:  Head: Normocephalic and atraumatic.  Eyes: Conjunctivae are normal.  Neck: Normal range of motion.  Pulmonary/Chest: Effort normal.  Musculoskeletal:       Right wrist: Normal.       Right hand: He exhibits tenderness and swelling (and ecchymosis, most notable at 4th MCP). He exhibits normal range of motion and normal capillary  refill. Normal sensation noted. Normal strength noted.  Neurological: He is alert and oriented to person, place, and time. Gait normal.  Skin: Skin is warm and dry.  Psychiatric: Affect normal.  Vitals reviewed.   Dg Hand Complete Right  Result Date: 04/05/2017 CLINICAL DATA:  Hand injury. Pain and swelling. Fourth metacarpal bruising. EXAM: RIGHT HAND - COMPLETE 3+ VIEW COMPARISON:  None. FINDINGS: There is no evidence of fracture or dislocation. There is no evidence of arthropathy or other focal bone abnormality. Soft tissues are unremarkable. IMPRESSION: 1. No acute findings. Electronically Signed   By: Signa Kell M.D.   On: 04/05/2017 18:48    Assessment and Plan :  1. Right hand pain - DG Hand Complete Right; Future 2. Sprain of right hand, initial encounter 3. Contusion of right hand, initial  encounter Plain films reassuring. Will treat with P.R.I.C.E principles. ACE bandage applied in office. Instructed to return to clinic if symptoms worsen, do not improve in 7-10 days, or as needed   Benjiman Core PA-C  [facility] 04/05/2017 6:54 PM

## 2017-04-13 DIAGNOSIS — G4733 Obstructive sleep apnea (adult) (pediatric): Secondary | ICD-10-CM | POA: Diagnosis not present

## 2017-06-02 DIAGNOSIS — G4733 Obstructive sleep apnea (adult) (pediatric): Secondary | ICD-10-CM | POA: Diagnosis not present

## 2017-06-02 DIAGNOSIS — B86 Scabies: Secondary | ICD-10-CM | POA: Diagnosis not present

## 2017-06-02 DIAGNOSIS — G4710 Hypersomnia, unspecified: Secondary | ICD-10-CM | POA: Diagnosis not present

## 2017-06-17 DIAGNOSIS — E039 Hypothyroidism, unspecified: Secondary | ICD-10-CM | POA: Diagnosis not present

## 2017-06-17 DIAGNOSIS — E7800 Pure hypercholesterolemia, unspecified: Secondary | ICD-10-CM | POA: Diagnosis not present

## 2017-06-17 DIAGNOSIS — Z125 Encounter for screening for malignant neoplasm of prostate: Secondary | ICD-10-CM | POA: Diagnosis not present

## 2017-06-17 DIAGNOSIS — Z0000 Encounter for general adult medical examination without abnormal findings: Secondary | ICD-10-CM | POA: Diagnosis not present

## 2017-06-22 DIAGNOSIS — Z1212 Encounter for screening for malignant neoplasm of rectum: Secondary | ICD-10-CM | POA: Diagnosis not present

## 2017-06-22 DIAGNOSIS — Z0000 Encounter for general adult medical examination without abnormal findings: Secondary | ICD-10-CM | POA: Diagnosis not present

## 2017-06-22 DIAGNOSIS — E7800 Pure hypercholesterolemia, unspecified: Secondary | ICD-10-CM | POA: Diagnosis not present

## 2017-06-22 DIAGNOSIS — F329 Major depressive disorder, single episode, unspecified: Secondary | ICD-10-CM | POA: Diagnosis not present

## 2017-06-28 DIAGNOSIS — F439 Reaction to severe stress, unspecified: Secondary | ICD-10-CM | POA: Diagnosis not present

## 2017-06-28 DIAGNOSIS — F411 Generalized anxiety disorder: Secondary | ICD-10-CM | POA: Diagnosis not present

## 2017-07-20 DIAGNOSIS — F439 Reaction to severe stress, unspecified: Secondary | ICD-10-CM | POA: Diagnosis not present

## 2017-07-20 DIAGNOSIS — F411 Generalized anxiety disorder: Secondary | ICD-10-CM | POA: Diagnosis not present

## 2017-12-22 DIAGNOSIS — E7800 Pure hypercholesterolemia, unspecified: Secondary | ICD-10-CM | POA: Diagnosis not present

## 2017-12-29 DIAGNOSIS — Z9989 Dependence on other enabling machines and devices: Secondary | ICD-10-CM | POA: Diagnosis not present

## 2017-12-29 DIAGNOSIS — E7800 Pure hypercholesterolemia, unspecified: Secondary | ICD-10-CM | POA: Diagnosis not present

## 2017-12-29 DIAGNOSIS — G4733 Obstructive sleep apnea (adult) (pediatric): Secondary | ICD-10-CM | POA: Diagnosis not present

## 2017-12-29 DIAGNOSIS — F329 Major depressive disorder, single episode, unspecified: Secondary | ICD-10-CM | POA: Diagnosis not present

## 2018-07-04 DIAGNOSIS — Z0000 Encounter for general adult medical examination without abnormal findings: Secondary | ICD-10-CM | POA: Diagnosis not present

## 2018-07-04 DIAGNOSIS — Z125 Encounter for screening for malignant neoplasm of prostate: Secondary | ICD-10-CM | POA: Diagnosis not present

## 2018-07-04 DIAGNOSIS — E7800 Pure hypercholesterolemia, unspecified: Secondary | ICD-10-CM | POA: Diagnosis not present

## 2018-07-04 DIAGNOSIS — E039 Hypothyroidism, unspecified: Secondary | ICD-10-CM | POA: Diagnosis not present

## 2018-07-07 DIAGNOSIS — Z0000 Encounter for general adult medical examination without abnormal findings: Secondary | ICD-10-CM | POA: Diagnosis not present

## 2018-07-07 DIAGNOSIS — E7800 Pure hypercholesterolemia, unspecified: Secondary | ICD-10-CM | POA: Diagnosis not present

## 2018-10-28 DIAGNOSIS — G4733 Obstructive sleep apnea (adult) (pediatric): Secondary | ICD-10-CM | POA: Diagnosis not present

## 2018-11-27 DIAGNOSIS — G4733 Obstructive sleep apnea (adult) (pediatric): Secondary | ICD-10-CM | POA: Diagnosis not present

## 2018-12-19 DIAGNOSIS — G4733 Obstructive sleep apnea (adult) (pediatric): Secondary | ICD-10-CM | POA: Diagnosis not present

## 2018-12-28 DIAGNOSIS — G4733 Obstructive sleep apnea (adult) (pediatric): Secondary | ICD-10-CM | POA: Diagnosis not present

## 2019-01-09 DIAGNOSIS — E7800 Pure hypercholesterolemia, unspecified: Secondary | ICD-10-CM | POA: Diagnosis not present

## 2019-01-09 DIAGNOSIS — E039 Hypothyroidism, unspecified: Secondary | ICD-10-CM | POA: Diagnosis not present

## 2019-01-09 DIAGNOSIS — G4733 Obstructive sleep apnea (adult) (pediatric): Secondary | ICD-10-CM | POA: Diagnosis not present

## 2019-01-09 DIAGNOSIS — Z9989 Dependence on other enabling machines and devices: Secondary | ICD-10-CM | POA: Diagnosis not present

## 2019-01-28 DIAGNOSIS — G4733 Obstructive sleep apnea (adult) (pediatric): Secondary | ICD-10-CM | POA: Diagnosis not present

## 2019-02-26 DIAGNOSIS — G4733 Obstructive sleep apnea (adult) (pediatric): Secondary | ICD-10-CM | POA: Diagnosis not present

## 2019-03-29 DIAGNOSIS — G4733 Obstructive sleep apnea (adult) (pediatric): Secondary | ICD-10-CM | POA: Diagnosis not present

## 2019-04-28 DIAGNOSIS — G4733 Obstructive sleep apnea (adult) (pediatric): Secondary | ICD-10-CM | POA: Diagnosis not present

## 2019-05-29 DIAGNOSIS — G4733 Obstructive sleep apnea (adult) (pediatric): Secondary | ICD-10-CM | POA: Diagnosis not present

## 2019-06-28 DIAGNOSIS — G4733 Obstructive sleep apnea (adult) (pediatric): Secondary | ICD-10-CM | POA: Diagnosis not present

## 2019-07-03 DIAGNOSIS — Z0000 Encounter for general adult medical examination without abnormal findings: Secondary | ICD-10-CM | POA: Diagnosis not present

## 2019-07-03 DIAGNOSIS — E7800 Pure hypercholesterolemia, unspecified: Secondary | ICD-10-CM | POA: Diagnosis not present

## 2019-07-03 DIAGNOSIS — Z125 Encounter for screening for malignant neoplasm of prostate: Secondary | ICD-10-CM | POA: Diagnosis not present

## 2019-07-03 DIAGNOSIS — E039 Hypothyroidism, unspecified: Secondary | ICD-10-CM | POA: Diagnosis not present

## 2019-07-10 DIAGNOSIS — Z0000 Encounter for general adult medical examination without abnormal findings: Secondary | ICD-10-CM | POA: Diagnosis not present

## 2019-07-29 DIAGNOSIS — G4733 Obstructive sleep apnea (adult) (pediatric): Secondary | ICD-10-CM | POA: Diagnosis not present

## 2019-08-14 DIAGNOSIS — K645 Perianal venous thrombosis: Secondary | ICD-10-CM | POA: Diagnosis not present

## 2019-08-29 DIAGNOSIS — R197 Diarrhea, unspecified: Secondary | ICD-10-CM | POA: Diagnosis not present

## 2019-08-29 DIAGNOSIS — Z23 Encounter for immunization: Secondary | ICD-10-CM | POA: Diagnosis not present

## 2019-08-29 DIAGNOSIS — K644 Residual hemorrhoidal skin tags: Secondary | ICD-10-CM | POA: Diagnosis not present

## 2019-08-29 DIAGNOSIS — N410 Acute prostatitis: Secondary | ICD-10-CM | POA: Diagnosis not present

## 2019-09-26 DIAGNOSIS — F419 Anxiety disorder, unspecified: Secondary | ICD-10-CM | POA: Diagnosis not present

## 2021-08-01 ENCOUNTER — Other Ambulatory Visit: Payer: Self-pay | Admitting: Internal Medicine

## 2021-08-01 DIAGNOSIS — Z8249 Family history of ischemic heart disease and other diseases of the circulatory system: Secondary | ICD-10-CM

## 2021-11-10 ENCOUNTER — Ambulatory Visit: Payer: Self-pay | Admitting: Surgery

## 2021-12-01 NOTE — Patient Instructions (Signed)
DUE TO COVID-19 ONLY ONE VISITOR IS ALLOWED TO COME WITH YOU AND STAY IN THE WAITING ROOM ONLY DURING PRE OP AND PROCEDURE DAY OF SURGERY.   Up to two visitors ages 16+ are allowed at one time in a patient's room.  The visitors may rotate out with other people throughout the day.  Additionally, up to two children between the ages of 48 and 75 are allowed and do not count toward the number of allowed visitors.  Children within this age range must be accompanied by an adult visitor.  One adult visitor may remain with the patient overnight and must be in the room by 8 PM.         Your procedure is scheduled on: 12-10-21   Report to [facility] Main  Entrance   Report to admitting at       1215 PM     Call this number if you have problems the morning of surgery [phone]   Remember: Follow diet and bowel prep instructions per MD order    NOTHING BY MOUTH EXCEPT CLEAR LIQUIDS UNTIL     1130 am  . PLEASE FINISH G2 DRINK PER SURGEON ORDER  WHICH NEEDS TO BE COMPLETED AT    1130 am.     CLEAR LIQUID DIET                                                                    water Black Coffee and tea, regular and decaf No Creamer                            Plain Jell-O any favor except red or purple                                  Fruit ices (not with fruit pulp)                                      Iced Popsicles                                                                        Cranberry, grape and apple juices Sports drinks like Gatorade Lightly seasoned clear broth or consume(fat free) Sugar, honey syrup  Sample Menu Breakfast                                Lunch                                     Supper Cranberry juice                    Beef broth  Chicken broth Jell-O                                     Grape juice                           Apple juice Coffee or tea                        Jell-O                                      Popsicle                                                 Coffee or tea                        Coffee or tea  _____________________________________________________________________     BRUSH YOUR TEETH MORNING OF SURGERY AND RINSE YOUR MOUTH OUT, NO CHEWING GUM CANDY OR MINTS.     Take these medicines the morning of surgery with A SIP OF WATER: sertraline, rosuvastatin, levothyroxine   DO NOT TAKE ANY DIABETIC MEDICATIONS DAY OF YOUR SURGERY                               You may not have any metal on your body including hair pins and              piercings  Do not wear jewelry, make-up, lotions, powders,perfumes,        deodorant                     Men may shave face and neck.   Do not bring valuables to the hospital. [facility] IS NOT             RESPONSIBLE   FOR VALUABLES.  Contacts, dentures or bridgework may not be worn into surgery.       Patients discharged the day of surgery will not be allowed to drive home. IF YOU ARE HAVING SURGERY AND GOING HOME THE SAME DAY, YOU MUST HAVE AN ADULT TO DRIVE YOU HOME AND BE WITH YOU FOR 24 HOURS. YOU MAY GO HOME BY TAXI OR UBER OR ORTHERWISE, BUT AN ADULT MUST ACCOMPANY YOU HOME AND STAY WITH YOU FOR 24 HOURS.  Name and phone number of your driver:  Special Instructions: N/A              Please read over the following fact sheets you were given: _____________________________________________________________________             [facility] - Preparing for Surgery Before surgery, you can play an important role.  Because skin is not sterile, your skin needs to be as free of germs as possible.  You can reduce the number of germs on your skin by washing with CHG (chlorahexidine gluconate) soap before surgery.  CHG is an antiseptic cleaner which kills germs and bonds with the skin to continue killing germs even after washing. Please DO NOT use if  you have an allergy to CHG or antibacterial soaps.  If your skin becomes reddened/irritated stop using the CHG  and inform your nurse when you arrive at Short Stay. Do not shave (including legs and underarms) for at least 48 hours prior to the first CHG shower.  You may shave your face/neck. Please follow these instructions carefully:  1.  Shower with CHG Soap the night before surgery and the  morning of Surgery.  2.  If you choose to wash your hair, wash your hair first as usual with your  normal  shampoo.  3.  After you shampoo, rinse your hair and body thoroughly to remove the  shampoo.                           4.  Use CHG as you would any other liquid soap.  You can apply chg directly  to the skin and wash                       Gently with a scrungie or clean washcloth.  5.  Apply the CHG Soap to your body ONLY FROM THE NECK DOWN.   Do not use on face/ open                           Wound or open sores. Avoid contact with eyes, ears mouth and genitals (private parts).                       Wash face,  Genitals (private parts) with your normal soap.             6.  Wash thoroughly, paying special attention to the area where your surgery  will be performed.  7.  Thoroughly rinse your body with warm water from the neck down.  8.  DO NOT shower/wash with your normal soap after using and rinsing off  the CHG Soap.                9.  Pat yourself dry with a clean towel.            10.  Wear clean pajamas.            11.  Place clean sheets on your bed the night of your first shower and do not  sleep with pets. Day of Surgery : Do not apply any lotions/deodorants the morning of surgery.  Please wear clean clothes to the hospital/surgery center.  FAILURE TO FOLLOW THESE INSTRUCTIONS MAY RESULT IN THE CANCELLATION OF YOUR SURGERY PATIENT SIGNATURE_________________________________  NURSE SIGNATURE__________________________________  ________________________________________________________________________

## 2021-12-01 NOTE — Progress Notes (Addendum)
PCP - Merri Brunette , MD Cardiologist - no  PPM/ICD -  Device Orders -  Rep Notified -   Chest x-ray -  EKG -  Stress Test -  ECHO -  Cardiac Cath -   Sleep Study -  CPAP -   Fasting Blood Sugar -  Checks Blood Sugar _____ times a day  Blood Thinner Instructions: Aspirin Instructions:  ERAS Protcol - PRE-SURGERY Ensure or G2-   COVID TEST- N/A COVID vaccine -  Activity--Walks golf  course 3 times a week no SOB Anesthesia review: OSA, wears cpap pre DM  Patient denies shortness of breath, fever, cough and chest pain at PAT appointment   All instructions explained to the patient, with a verbal understanding of the material. Patient agrees to go over the instructions while at home for a better understanding. Patient also instructed to self quarantine after being tested for COVID-19. The opportunity to ask questions was provided.

## 2021-12-02 ENCOUNTER — Other Ambulatory Visit: Payer: Self-pay

## 2021-12-02 ENCOUNTER — Encounter (HOSPITAL_COMMUNITY)
Admission: RE | Admit: 2021-12-02 | Discharge: 2021-12-02 | Disposition: A | Discharge status: Home / Self Care | Payer: 59 | Source: Ambulatory Visit | Attending: Surgery | Admitting: Surgery

## 2021-12-02 ENCOUNTER — Encounter (HOSPITAL_COMMUNITY): Payer: Self-pay

## 2021-12-02 VITALS — BP 111/71 | HR 59 | Temp 97.8°F | Resp 16 | Ht 69.5 in | Wt 188.0 lb

## 2021-12-02 DIAGNOSIS — Z01812 Encounter for preprocedural laboratory examination: Secondary | ICD-10-CM | POA: Diagnosis present

## 2021-12-02 DIAGNOSIS — Z01818 Encounter for other preprocedural examination: Secondary | ICD-10-CM

## 2021-12-02 HISTORY — DX: Unspecified osteoarthritis, unspecified site: M19.90

## 2021-12-02 HISTORY — DX: Prediabetes: R73.03

## 2021-12-02 LAB — CBC
HCT: 44.1 % (ref 39.0–52.0)
Hemoglobin: 14.8 g/dL (ref 13.0–17.0)
MCH: 31 pg (ref 26.0–34.0)
MCHC: 33.6 g/dL (ref 30.0–36.0)
MCV: 92.3 fL (ref 80.0–100.0)
Platelets: 152 10*3/uL (ref 150–400)
RBC: 4.78 MIL/uL (ref 4.22–5.81)
RDW: 13.2 % (ref 11.5–15.5)
WBC: 5.2 10*3/uL (ref 4.0–10.5)
nRBC: 0 % (ref 0.0–0.2)

## 2021-12-02 LAB — HEMOGLOBIN A1C
Hgb A1c MFr Bld: 5.4 % (ref 4.8–5.6)
Mean Plasma Glucose: 108.28 mg/dL

## 2021-12-02 LAB — GLUCOSE, CAPILLARY: Glucose-Capillary: 94 mg/dL (ref 70–99)

## 2021-12-04 ENCOUNTER — Observation Stay (HOSPITAL_COMMUNITY)
Admission: EM | Admit: 2021-12-04 | Discharge: 2021-12-06 | Disposition: A | Discharge status: Home / Self Care | Payer: 59 | Attending: Internal Medicine | Admitting: Internal Medicine

## 2021-12-04 ENCOUNTER — Emergency Department (HOSPITAL_COMMUNITY)
Admission: EM | Admit: 2021-12-04 | Discharge: 2021-12-04 | Discharge status: Against Medical Advice | Payer: 59 | Source: Home / Self Care

## 2021-12-04 ENCOUNTER — Emergency Department (HOSPITAL_COMMUNITY): Payer: 59

## 2021-12-04 ENCOUNTER — Encounter (HOSPITAL_COMMUNITY): Payer: Self-pay

## 2021-12-04 DIAGNOSIS — E039 Hypothyroidism, unspecified: Secondary | ICD-10-CM | POA: Diagnosis not present

## 2021-12-04 DIAGNOSIS — Z23 Encounter for immunization: Secondary | ICD-10-CM | POA: Diagnosis not present

## 2021-12-04 DIAGNOSIS — E860 Dehydration: Secondary | ICD-10-CM | POA: Diagnosis not present

## 2021-12-04 DIAGNOSIS — I10 Essential (primary) hypertension: Secondary | ICD-10-CM | POA: Diagnosis not present

## 2021-12-04 DIAGNOSIS — I951 Orthostatic hypotension: Principal | ICD-10-CM | POA: Insufficient documentation

## 2021-12-04 DIAGNOSIS — R55 Syncope and collapse: Secondary | ICD-10-CM | POA: Diagnosis present

## 2021-12-04 DIAGNOSIS — Z20822 Contact with and (suspected) exposure to covid-19: Secondary | ICD-10-CM | POA: Insufficient documentation

## 2021-12-04 DIAGNOSIS — R7303 Prediabetes: Secondary | ICD-10-CM | POA: Diagnosis not present

## 2021-12-04 DIAGNOSIS — Z79899 Other long term (current) drug therapy: Secondary | ICD-10-CM | POA: Insufficient documentation

## 2021-12-04 LAB — BASIC METABOLIC PANEL
Anion gap: 8 (ref 5–15)
BUN: 19 mg/dL (ref 8–23)
CO2: 28 mmol/L (ref 22–32)
Calcium: 9.5 mg/dL (ref 8.9–10.3)
Chloride: 106 mmol/L (ref 98–111)
Creatinine, Ser: 0.79 mg/dL (ref 0.61–1.24)
GFR, Estimated: >60 mL/min (ref >60)
Glucose, Bld: 91 mg/dL (ref 70–99)
Potassium: 3.6 mmol/L (ref 3.5–5.1)
Sodium: 142 mmol/L (ref 135–145)

## 2021-12-04 LAB — HEPATIC FUNCTION PANEL
ALT: 35 U/L (ref 0–44)
AST: 23 U/L (ref 15–41)
Albumin: 4.7 g/dL (ref 3.5–5.0)
Alkaline Phosphatase: 51 U/L (ref 38–126)
Bilirubin, Direct: 0.1 mg/dL (ref 0.0–0.2)
Indirect Bilirubin: 0.4 mg/dL (ref 0.3–0.9)
Total Bilirubin: 0.5 mg/dL (ref 0.3–1.2)
Total Protein: 6.9 g/dL (ref 6.5–8.1)

## 2021-12-04 LAB — CBC
HCT: 44.2 % (ref 39.0–52.0)
Hemoglobin: 14.4 g/dL (ref 13.0–17.0)
MCH: 30.3 pg (ref 26.0–34.0)
MCHC: 32.6 g/dL (ref 30.0–36.0)
MCV: 92.9 fL (ref 80.0–100.0)
Platelets: 152 10*3/uL (ref 150–400)
RBC: 4.76 MIL/uL (ref 4.22–5.81)
RDW: 13.2 % (ref 11.5–15.5)
WBC: 5.9 10*3/uL (ref 4.0–10.5)
nRBC: 0 % (ref 0.0–0.2)

## 2021-12-04 LAB — CBG MONITORING, ED: Glucose-Capillary: 79 mg/dL (ref 70–99)

## 2021-12-04 LAB — TROPONIN I (HIGH SENSITIVITY): Troponin I (High Sensitivity): 6 ng/L (ref <18)

## 2021-12-04 MED ORDER — SODIUM CHLORIDE 0.9 % IV BOLUS
1000.0000 mL | Freq: Once | INTRAVENOUS | Status: AC
  Administered 2021-12-05: 1000 mL via INTRAVENOUS

## 2021-12-04 NOTE — ED Provider Notes (Signed)
MSE note.  Patient states that he has had 4 episodes today where he felt very dizzy and weak and went down to his knees and practically passed out but then started to feel better.  He was walking playing golf and has happened several times.  Patient had no chest pain and has had no shortness of breath or sweating.  Physical exam patient alert vital signs normal lungs clear heart regular rate and rhythm.  Labs and chest x-ray EKG and CT head done   Bethann Berkshire, MD 12/04/21 1836

## 2021-12-04 NOTE — ED Triage Notes (Signed)
Pt arrived via POV, c/o near syncopal event x4 today. Aox4 in triage. No head strike.

## 2021-12-05 ENCOUNTER — Emergency Department (HOSPITAL_COMMUNITY): Payer: 59

## 2021-12-05 ENCOUNTER — Observation Stay (HOSPITAL_BASED_OUTPATIENT_CLINIC_OR_DEPARTMENT_OTHER): Payer: 59

## 2021-12-05 ENCOUNTER — Other Ambulatory Visit: Payer: Self-pay

## 2021-12-05 ENCOUNTER — Encounter (HOSPITAL_COMMUNITY): Payer: Self-pay

## 2021-12-05 DIAGNOSIS — R55 Syncope and collapse: Secondary | ICD-10-CM | POA: Diagnosis not present

## 2021-12-05 LAB — ECHOCARDIOGRAM COMPLETE
AR max vel: 2.22 cm2
AV Area VTI: 2.17 cm2
AV Area mean vel: 2.14 cm2
AV Mean grad: 3 mmHg
AV Peak grad: 7.1 mmHg
Ao pk vel: 1.33 m/s
Area-P 1/2: 4.06 cm2
Calc EF: 58.1 %
Height: 69.5 in
S' Lateral: 3 cm
Single Plane A2C EF: 58.9 %
Single Plane A4C EF: 56.7 %
Weight: 3008 oz

## 2021-12-05 LAB — RESP PANEL BY RT-PCR (FLU A&B, COVID) ARPGX2
Influenza A by PCR: NEGATIVE
Influenza B by PCR: NEGATIVE
SARS Coronavirus 2 by RT PCR: NEGATIVE

## 2021-12-05 LAB — URINALYSIS, ROUTINE W REFLEX MICROSCOPIC
Bilirubin Urine: NEGATIVE
Glucose, UA: NEGATIVE mg/dL
Hgb urine dipstick: NEGATIVE
Ketones, ur: NEGATIVE mg/dL
Leukocytes,Ua: NEGATIVE
Nitrite: NEGATIVE
Protein, ur: NEGATIVE mg/dL
Specific Gravity, Urine: >1.03 — ABNORMAL HIGH (ref 1.005–1.030)
pH: 5.5 (ref 5.0–8.0)

## 2021-12-05 LAB — TROPONIN I (HIGH SENSITIVITY): Troponin I (High Sensitivity): 6 ng/L (ref <18)

## 2021-12-05 LAB — D-DIMER, QUANTITATIVE: D-Dimer, Quant: 0.43 ug/mL-FEU (ref 0.00–0.50)

## 2021-12-05 MED ORDER — SODIUM CHLORIDE (PF) 0.9 % IJ SOLN
INTRAMUSCULAR | Status: AC
  Filled 2021-12-05: qty 50

## 2021-12-05 MED ORDER — SODIUM CHLORIDE 0.9 % IV SOLN: INTRAVENOUS | Status: DC

## 2021-12-05 MED ORDER — ONDANSETRON HCL 4 MG PO TABS: 4.0000 mg | ORAL_TABLET | Freq: Four times a day (QID) | ORAL | Status: DC | PRN

## 2021-12-05 MED ORDER — DICLOFENAC SODIUM 75 MG PO TBEC
75.0000 mg | DELAYED_RELEASE_TABLET | Freq: Every day | ORAL | Status: DC
  Administered 2021-12-05 – 2021-12-06 (×2): 75 mg via ORAL
  Filled 2021-12-05 (×2): qty 1

## 2021-12-05 MED ORDER — METOCLOPRAMIDE HCL 5 MG/ML IJ SOLN
10.0000 mg | Freq: Once | INTRAMUSCULAR | Status: AC
  Administered 2021-12-05: 10 mg via INTRAVENOUS
  Filled 2021-12-05: qty 2

## 2021-12-05 MED ORDER — ROSUVASTATIN CALCIUM 20 MG PO TABS
40.0000 mg | ORAL_TABLET | Freq: Every day | ORAL | Status: DC
  Administered 2021-12-05 – 2021-12-06 (×2): 40 mg via ORAL
  Filled 2021-12-05 (×2): qty 2

## 2021-12-05 MED ORDER — ACETAMINOPHEN 650 MG RE SUPP: 650.0000 mg | Freq: Four times a day (QID) | RECTAL | Status: DC | PRN

## 2021-12-05 MED ORDER — ACETAMINOPHEN 325 MG PO TABS: 650.0000 mg | ORAL_TABLET | Freq: Four times a day (QID) | ORAL | Status: DC | PRN

## 2021-12-05 MED ORDER — IOHEXOL 350 MG/ML SOLN
80.0000 mL | Freq: Once | INTRAVENOUS | Status: AC | PRN
  Administered 2021-12-05: 80 mL via INTRAVENOUS

## 2021-12-05 MED ORDER — DIPHENHYDRAMINE HCL 50 MG/ML IJ SOLN
25.0000 mg | Freq: Once | INTRAMUSCULAR | Status: AC
  Administered 2021-12-05: 25 mg via INTRAVENOUS
  Filled 2021-12-05: qty 1

## 2021-12-05 MED ORDER — KETOROLAC TROMETHAMINE 30 MG/ML IJ SOLN
30.0000 mg | Freq: Once | INTRAMUSCULAR | Status: AC
  Administered 2021-12-05: 30 mg via INTRAVENOUS
  Filled 2021-12-05: qty 1

## 2021-12-05 MED ORDER — ADULT MULTIVITAMIN W/MINERALS CH
1.0000 | ORAL_TABLET | Freq: Every day | ORAL | Status: DC
  Administered 2021-12-05 – 2021-12-06 (×2): 1 via ORAL
  Filled 2021-12-05 (×2): qty 1

## 2021-12-05 MED ORDER — SODIUM CHLORIDE 0.9% FLUSH: 3.0000 mL | Freq: Two times a day (BID) | INTRAVENOUS | Status: DC

## 2021-12-05 MED ORDER — INFLUENZA VAC SPLIT QUAD 0.5 ML IM SUSY
0.5000 mL | PREFILLED_SYRINGE | INTRAMUSCULAR | Status: AC
  Administered 2021-12-06: 0.5 mL via INTRAMUSCULAR
  Filled 2021-12-05: qty 0.5

## 2021-12-05 MED ORDER — ONDANSETRON HCL 4 MG/2ML IJ SOLN: 4.0000 mg | Freq: Four times a day (QID) | INTRAMUSCULAR | Status: DC | PRN

## 2021-12-05 MED ORDER — LEVOTHYROXINE SODIUM 50 MCG PO TABS
50.0000 ug | ORAL_TABLET | Freq: Every day | ORAL | Status: DC
  Administered 2021-12-05 – 2021-12-06 (×2): 50 ug via ORAL
  Filled 2021-12-05 (×2): qty 1

## 2021-12-05 NOTE — ED Notes (Signed)
Pt back from CT

## 2021-12-05 NOTE — ED Notes (Signed)
Called lab and verified they have blood already collected for d-dimer

## 2021-12-05 NOTE — H&P (Signed)
History and Physical    ALMER BUSHEY GGY:[id number] DOB: [birth year] DOA: 12/04/2021  PCP: Merri Brunette, MD  Patient coming from: home  Chief Complaint: syncopal episode  HPI: Russell Hayden is a 60 y.o. male with medical history significant of anxiety, HTN, HLD. Presenting with dizziness and syncope. He reports that he was golfing yesterday morning when he started having dizzy spells. They would last 5 seconds or so. Bending over made it worse. He continued golfing for another 45 minutes, but the episodes became unbearable, so he asked someone to bring him home. He was moving about his house and had syncopal episode. He felt tunnel vision and then passed out. He did not hit his head. It was unwitnessed, so he is not entirely sure how long he was down. He was immediately able to get up once he came to. He tried to get in with his PCP, but was unable to. So he came to the ED for help. He denies any other aggravating or alleviating factors.    ED Course: CTH, CTAH&N were negative. EKG was ok. Trp were neg x 2. He was orthostatic. He was given fluids. TRH was called for admission.   Review of Systems:  Denies CP, palpitations, dyspnea, N/V/D, fevers, sick contacts. Review of systems is otherwise negative for all not mentioned in HPI.   PMHx Past Medical History:  Diagnosis Date   Allergic rhinitis    Anxiety    Arthritis    Chronic low back pain    Depression    Hyperlipidemia    IBS (irritable bowel syndrome)    Idiopathic thrombocytopenia (HCC)    OSA (obstructive sleep apnea)    does use a cpap   Pre-diabetes     PSHx Past Surgical History:  Procedure Laterality Date   COLONOSCOPY     KNEE ARTHROSCOPY  02/01/2013   Procedure: ARTHROSCOPY KNEE;  Surgeon: Nestor Lewandowsky, MD;  Location: [facility];  Service: Orthopedics;  Laterality: Left;   KNEE ARTHROSCOPY Left 04/09/2014   Procedure: LEFT ARTHROSCOPY KNEE, CHONDROPLASTY;  Surgeon: Nestor Lewandowsky, MD;  Location: [facility];  Service: Orthopedics;  Laterality: Left;   LAMINECTOMY     approx 2000   NASAL SINUS SURGERY     approx late 1990s.  deviated septum   SHOULDER ARTHROSCOPY Left 04/09/2014   Procedure: LEFT ARTHROSCOPY SHOULDER, DISTAL CLAVICLE EXCISION, ACROMIOPLASTY;  Surgeon: Nestor Lewandowsky, MD;  Location: [facility];  Service: Orthopedics;  Laterality: Left;   SPINE SURGERY     TONSILLECTOMY      SocHx  reports that he has never smoked. He has never used smokeless tobacco. He reports current alcohol use. He reports that he does not use drugs.  Allergies  Allergen Reactions   Penicillin G     Other reaction(s): Unknown   Penicillins     Unknown childhood reaction    FamHx Family History  Problem Relation Age of Onset   Hyperlipidemia Mother    Hypertension Mother    Celiac disease Mother    Asthma Mother    Allergies Mother    Hypertension Brother    Celiac disease Brother    Leukemia Father    Skin cancer Father    Cancer Paternal Grandmother     Prior to Admission medications   Medication Sig Start Date End Date Taking? Authorizing Provider  diclofenac (VOLTAREN) 75 MG EC tablet Take 75 mg by mouth daily.   Yes [provider]  FIBER PO Take 1 capsule by mouth daily as needed (for constipation).   Yes [provider]  levothyroxine (SYNTHROID) 50 MCG tablet Take 50 mcg by mouth daily. 09/18/21  Yes [provider]  Multiple Vitamins-Minerals (MENS 50+ MULTI VITAMIN/MIN) TABS Take 1 tablet by mouth daily.   Yes [provider]  rosuvastatin (CRESTOR) 40 MG tablet Take 40 mg by mouth daily.   Yes [provider]  sertraline (ZOLOFT) 100 MG tablet Take 200 mg by mouth daily.   Yes [provider]  tirzepatide Greggory Keen) 5 MG/0.5ML Pen Inject 5 mg into the skin every Sunday.   Yes [provider]    Physical Exam: Vitals:   12/05/21 0321 12/05/21 0435 12/05/21 0604 12/05/21 0630  BP:  137/77 126/69 126/60 108/63  Pulse: 65 62 74 73  Resp:  12 17 15   Temp:      TempSrc:      SpO2: 97% 99% 99% 97%    General: 60 y.o. male resting in bed in NAD Eyes: PERRL, normal sclera ENMT: Nares patent w/o discharge, orophaynx clear, dentition normal, ears w/o discharge/lesions/ulcers Neck: Supple, trachea midline Cardiovascular: RRR, +S1, S2, no m/g/r, equal pulses throughout Respiratory: CTABL, no w/r/r, normal WOB GI: BS+, NDNT, no masses noted, no organomegaly noted MSK: No e/c/c Neuro: A&O x 3, no focal deficits Psyc: Appropriate interaction and affect, calm/cooperative  Labs on Admission: I have personally reviewed following labs and imaging studies  CBC: Recent Labs  Lab 12/02/21 0954 12/04/21 1918  WBC 5.2 5.9  HGB 14.8 14.4  HCT 44.1 44.2  MCV 92.3 92.9  PLT 152 152   Basic Metabolic Panel: Recent Labs  Lab 12/04/21 1918  NA 142  K 3.6  CL 106  CO2 28  GLUCOSE 91  BUN 19  CREATININE 0.79  CALCIUM 9.5   GFR: Estimated Creatinine Clearance: 98.6 mL/min (by C-G formula based on SCr of 0.79 mg/dL). Liver Function Tests: Recent Labs  Lab 12/04/21 1918  AST 23  ALT 35  ALKPHOS 51  BILITOT 0.5  PROT 6.9  ALBUMIN 4.7   No results for input(s): LIPASE, AMYLASE in the last 168 hours. No results for input(s): AMMONIA in the last 168 hours. Coagulation Profile: No results for input(s): INR, PROTIME in the last 168 hours. Cardiac Enzymes: No results for input(s): CKTOTAL, CKMB, CKMBINDEX, TROPONINI in the last 168 hours. BNP (last 3 results) No results for input(s): PROBNP in the last 8760 hours. HbA1C: Recent Labs    12/02/21 0954  HGBA1C 5.4   CBG: Recent Labs  Lab 12/02/21 0947 12/04/21 1916  GLUCAP 94 79   Lipid Profile: No results for input(s): CHOL, HDL, LDLCALC, TRIG, CHOLHDL, LDLDIRECT in the last 72 hours. Thyroid Function Tests: No results for input(s): TSH, T4TOTAL, FREET4, T3FREE, THYROIDAB in the last 72 hours. Anemia  Panel: No results for input(s): VITAMINB12, FOLATE, FERRITIN, TIBC, IRON, RETICCTPCT in the last 72 hours. Urine analysis:    Component Value Date/Time   COLORURINE YELLOW 12/05/2021 0332   APPEARANCEUR CLEAR 12/05/2021 0332   LABSPEC >1.030 (H) 12/05/2021 0332   PHURINE 5.5 12/05/2021 0332   GLUCOSEU NEGATIVE 12/05/2021 0332   HGBUR NEGATIVE 12/05/2021 0332   BILIRUBINUR NEGATIVE 12/05/2021 0332   KETONESUR NEGATIVE 12/05/2021 0332   PROTEINUR NEGATIVE 12/05/2021 0332   UROBILINOGEN 0.2 07/03/2014 1923   NITRITE NEGATIVE 12/05/2021 0332   LEUKOCYTESUR NEGATIVE 12/05/2021 0332    Radiological Exams on Admission: DG Chest 2 View  Result Date: 12/04/2021 CLINICAL DATA:  Weakness. EXAM: CHEST - 2 VIEW COMPARISON:  Chest x-ray 07/03/2014. FINDINGS: The heart size and mediastinal contours are within normal limits. Both lungs are clear. The visualized skeletal structures are unremarkable. IMPRESSION: No active cardiopulmonary disease. Electronically Signed   By: Darliss Cheney M.D.   On: 12/04/2021 18:55   CT Head Wo Contrast  Result Date: 12/04/2021 CLINICAL DATA:  Dizziness EXAM: CT HEAD WITHOUT CONTRAST TECHNIQUE: Contiguous axial images were obtained from the base of the skull through the vertex without intravenous contrast. COMPARISON:  None. FINDINGS: Brain: No acute intracranial hemorrhage, mass effect, or herniation. No extra-axial fluid collections. No evidence of acute territorial infarct. No hydrocephalus. Vascular: No hyperdense vessel or unexpected calcification. Skull: Normal. Negative for fracture or focal lesion. Sinuses/Orbits: No acute finding. Other: None. IMPRESSION: No acute intracranial process identified. Electronically Signed   By: Jannifer Hick M.D.   On: 12/04/2021 19:01    EKG: Independently reviewed. Sinus, no st elevations  Assessment/Plan Syncope     - place in obs, tele     - he is orthostatic; give fluids     - EKG ok, trp neg x 2     - CTH/CTAH&N wnl      - complete w/u with echo  Hypothyroidism     - continue home synthroid  Anxiety     - hold his zoloft for right now as it can cause dizziness  HLD     - continue home statin  DVT prophylaxis: SCDs  Code Status: FULL  Family Communication: None   Consults called: None  Status is: Observation  The patient remains OBS appropriate and will d/c before 2 midnights.  Teddy Spike DO [facility]  If 7PM-7AM, please contact night-coverage [email or web address]  12/05/2021, 7:20 AM

## 2021-12-05 NOTE — ED Provider Notes (Signed)
[facility] DEPT Provider Note   CSN: [id number] Arrival date & time: 12/04/21  1740     History Chief Complaint  Patient presents with   Near Syncope    Russell Hayden is a 60 y.o. male.  Patient presents with near syncope and syncopal episodes today.  States he woke up feeling normal this morning and ate breakfast.  He went to play golf at 11 AM.  Shortly after starting to play golf he began to feel lightheaded and dizzy but denies any room spinning.  Symptoms came and went lasting for several minutes to seconds at a time.  Did resolve almost completely was able to walk and play golf in between.  Dizzy spells recurred and he began to feel worse.  Estimates he had 5-10 spells of dizziness while playing golf.  He felt poorly went home after 9 holes in 2 hours.  He denies any chest pain, shortness of breath, nausea, vomiting, diaphoresis, visual changes.  No focal weakness, numbness or tingling. Upon returning home he went to his home office and was standing at his desk when he "went out".  He fell to the ground he does not know how that happened.  Denies having dizzy spells at that time.  Does not think he hit his head.  Denies any neck or back pain.  No chest pain or abdominal pain. No diaphoresis. He is never passed out before.  Denies any cardiac history. No tongue biting or incontinence.  Now has a diffuse headache.  Does not think he hit his head.  Denies having a headache prior to the dizziness spells and denies sudden onset headache with syncope. He has waited for over 10 hours and has not had anything to eat all day but ate normally this morning  The history is provided by the patient and the spouse.  Near Syncope Associated symptoms include headaches. Pertinent negatives include no chest pain, no abdominal pain and no shortness of breath.      Past Medical History:  Diagnosis Date   Allergic rhinitis    Anxiety    Arthritis    Chronic low back  pain    Depression    Hyperlipidemia    IBS (irritable bowel syndrome)    Idiopathic thrombocytopenia (HCC)    OSA (obstructive sleep apnea)    does use a cpap   Pre-diabetes     Patient Active Problem List   Diagnosis Date Noted   Fever 07/03/2014   Tick bite 07/03/2014   Tick fever 07/03/2014   Impingement syndrome of left shoulder 04/09/2014   OSA (obstructive sleep apnea) 03/19/2011    Past Surgical History:  Procedure Laterality Date   COLONOSCOPY     KNEE ARTHROSCOPY  02/01/2013   Procedure: ARTHROSCOPY KNEE;  Surgeon: Kerin Salen, MD;  Location: [facility];  Service: Orthopedics;  Laterality: Left;   KNEE ARTHROSCOPY Left 04/09/2014   Procedure: LEFT ARTHROSCOPY KNEE, CHONDROPLASTY;  Surgeon: Kerin Salen, MD;  Location: [place];  Service: Orthopedics;  Laterality: Left;   LAMINECTOMY     approx 2000   NASAL SINUS SURGERY     approx late 1990s.  deviated septum   SHOULDER ARTHROSCOPY Left 04/09/2014   Procedure: LEFT ARTHROSCOPY SHOULDER, DISTAL CLAVICLE EXCISION, ACROMIOPLASTY;  Surgeon: Kerin Salen, MD;  Location: [facility];  Service: Orthopedics;  Laterality: Left;   SPINE SURGERY     TONSILLECTOMY  Family History  Problem Relation Age of Onset   Hyperlipidemia Mother    Hypertension Mother    Celiac disease Mother    Asthma Mother    Allergies Mother    Hypertension Brother    Celiac disease Brother    Leukemia Father    Skin cancer Father    Cancer Paternal Grandmother     Social History   Tobacco Use   Smoking status: Never   Smokeless tobacco: Never  Vaping Use   Vaping Use: Never used  Substance Use Topics   Alcohol use: Yes    Comment: 1 beer a week   Drug use: No    Home Medications Prior to Admission medications   Medication Sig Start Date End Date Taking? Authorizing Provider  cetirizine (ZYRTEC) 10 MG tablet Take 10 mg by mouth daily as needed for allergies.    [provider]  diclofenac (VOLTAREN) 75 MG EC tablet Take 75 mg by mouth daily.    [provider]  FIBER PO Take 1 capsule by mouth daily.    [provider]  levothyroxine (SYNTHROID) 50 MCG tablet Take 50 mcg by mouth daily. 09/18/21   [provider]  Multiple Vitamins-Minerals (MENS 50+ MULTI VITAMIN/MIN) TABS Take 1 tablet by mouth daily.    [provider]  rosuvastatin (CRESTOR) 40 MG tablet Take 40 mg by mouth daily.    [provider]  sertraline (ZOLOFT) 100 MG tablet Take 200 mg by mouth daily.    [provider]  tirzepatide Darcel Bayley) 5 MG/0.5ML Pen Inject 5 mg into the skin every Sunday.    [provider]    Allergies    Penicillin g and Penicillins  Review of Systems   Review of Systems  Constitutional:  Positive for activity change. Negative for fatigue and fever.  HENT:  Negative for congestion and rhinorrhea.   Respiratory:  Negative for cough, chest tightness and shortness of breath.   Cardiovascular:  Positive for near-syncope. Negative for chest pain.  Gastrointestinal:  Negative for abdominal pain, nausea and vomiting.  Genitourinary:  Negative for dysuria, hematuria and urgency.  Musculoskeletal:  Negative for arthralgias and myalgias.  Skin:  Negative for wound.  Neurological:  Positive for dizziness, syncope, light-headedness and headaches.   all other systems are negative except as noted in the HPI and PMH.   Physical Exam Updated Vital Signs BP 120/72 (BP Location: Right Arm)   Pulse 65   Temp (!) 97.5 F (36.4 C) (Oral)   Resp 16   SpO2 98%   Physical Exam Vitals and nursing note reviewed.  Constitutional:      General: He is not in acute distress.    Appearance: He is well-developed.  HENT:     Head: Normocephalic and atraumatic.     Comments: No hematoma or abrasions to scalp.     Mouth/Throat:     Pharynx: No oropharyngeal exudate.  Eyes:     Conjunctiva/sclera: Conjunctivae  normal.     Pupils: Pupils are equal, round, and reactive to light.  Neck:     Comments: No meningismus. Cardiovascular:     Rate and Rhythm: Normal rate and regular rhythm.     Heart sounds: Normal heart sounds. No murmur heard. Pulmonary:     Effort: Pulmonary effort is normal. No respiratory distress.     Breath sounds: Normal breath sounds.  Abdominal:     Palpations: Abdomen is soft.     Tenderness: There is no  abdominal tenderness. There is no guarding or rebound.  Musculoskeletal:        General: No tenderness. Normal range of motion.     Cervical back: Normal range of motion and neck supple.  Skin:    General: Skin is warm.  Neurological:     Mental Status: He is alert and oriented to person, place, and time.     Cranial Nerves: No cranial nerve deficit.     Motor: No abnormal muscle tone.     Coordination: Coordination normal.     Comments:  5/5 strength throughout. CN 2-12 intact.Equal grip strength.   Psychiatric:        Behavior: Behavior normal.    ED Results / Procedures / Treatments   Labs (all labs ordered are listed, but only abnormal results are displayed) Labs Reviewed  URINALYSIS, ROUTINE W REFLEX MICROSCOPIC - Abnormal; Notable for the following components:      Result Value   Specific Gravity, Urine >1.030 (*)    All other components within normal limits  RESP PANEL BY RT-PCR (FLU A&B, COVID) ARPGX2  BASIC METABOLIC PANEL  CBC  HEPATIC FUNCTION PANEL  D-DIMER, QUANTITATIVE  CBG MONITORING, ED  TROPONIN I (HIGH SENSITIVITY)  TROPONIN I (HIGH SENSITIVITY)    EKG EKG Interpretation  Date/Time:  Thursday December 04 2021 18:15:32 EST Ventricular Rate:  67 PR Interval:  183 QRS Duration: 90 QT Interval:  390 QTC Calculation: 412 R Axis:   67 Text Interpretation: Age not entered, assumed to be  60 years old for purpose of ECG interpretation Sinus rhythm Confirmed by Bethann Berkshire [phone] on 12/04/2021 6:32:49 PM  Radiology DG Chest 2  View  Result Date: 12/04/2021 CLINICAL DATA:  Weakness. EXAM: CHEST - 2 VIEW COMPARISON:  Chest x-ray 07/03/2014. FINDINGS: The heart size and mediastinal contours are within normal limits. Both lungs are clear. The visualized skeletal structures are unremarkable. IMPRESSION: No active cardiopulmonary disease. Electronically Signed   By: Darliss Cheney M.D.   On: 12/04/2021 18:55   CT Head Wo Contrast  Result Date: 12/04/2021 CLINICAL DATA:  Dizziness EXAM: CT HEAD WITHOUT CONTRAST TECHNIQUE: Contiguous axial images were obtained from the base of the skull through the vertex without intravenous contrast. COMPARISON:  None. FINDINGS: Brain: No acute intracranial hemorrhage, mass effect, or herniation. No extra-axial fluid collections. No evidence of acute territorial infarct. No hydrocephalus. Vascular: No hyperdense vessel or unexpected calcification. Skull: Normal. Negative for fracture or focal lesion. Sinuses/Orbits: No acute finding. Other: None. IMPRESSION: No acute intracranial process identified. Electronically Signed   By: Jannifer Hick M.D.   On: 12/04/2021 19:01    Procedures Procedures   Medications Ordered in ED Medications  ketorolac (TORADOL) 30 MG/ML injection 30 mg (has no administration in time range)  metoCLOPramide (REGLAN) injection 10 mg (has no administration in time range)  diphenhydrAMINE (BENADRYL) injection 25 mg (has no administration in time range)  sodium chloride 0.9 % bolus 1,000 mL (1,000 mLs Intravenous New Bag/Given 12/05/21 0355)    ED Course  I have reviewed the triage vital signs and the nursing notes.  Pertinent labs & imaging results that were available during my care of the patient were reviewed by me and considered in my medical decision making (see chart for details).    MDM Rules/Calculators/A&P                           Dizzy spells characterized by lightheadedness while on the golf  course.  Sudden onset of loss of consciousness while at  home.  No preceding dizziness at that time.  Nonfocal neurological exam.  EKG is normal, no prolonged QT or Brugada Labs reassuring.  Troponin negative x2.  Orthostatics are negative.  CT head in triage is negative  Troponin negative x2.  D-dimer negative.  Low suspicion for ACS or pulmonary embolism.  EKG here is reassuring.  normal intervals.  No prolonged QT or Brugada.  Concern for syncope without prodrome while he was at his home.  Did have several episodes of near syncope while on the golf course requiring him to stop early.  Concern for possible cardiogenic syncope or arrhythmia. Denies sudden onset headache preceding syncope.  Denies thunderclap onset headache. Does not think he hit his head.  Would benefit from further observation with echocardiogram and consideration of Holter monitor.  Discussed with Dr. Alcario Drought. Final Clinical Impression(s) / ED Diagnoses Final diagnoses:  Syncope and collapse    Rx / DC Orders ED Discharge Orders     None        Karriem Muench, Annie Main, MD 12/05/21 [phone]

## 2021-12-05 NOTE — ED Notes (Signed)
Pt gone to CT 

## 2021-12-06 DIAGNOSIS — E860 Dehydration: Secondary | ICD-10-CM | POA: Diagnosis not present

## 2021-12-06 DIAGNOSIS — I951 Orthostatic hypotension: Secondary | ICD-10-CM

## 2021-12-06 DIAGNOSIS — R55 Syncope and collapse: Secondary | ICD-10-CM | POA: Diagnosis not present

## 2021-12-06 LAB — COMPREHENSIVE METABOLIC PANEL
ALT: 34 U/L (ref 0–44)
AST: 20 U/L (ref 15–41)
Albumin: 4 g/dL (ref 3.5–5.0)
Alkaline Phosphatase: 46 U/L (ref 38–126)
Anion gap: 6 (ref 5–15)
BUN: 14 mg/dL (ref 8–23)
CO2: 25 mmol/L (ref 22–32)
Calcium: 8.8 mg/dL — ABNORMAL LOW (ref 8.9–10.3)
Chloride: 109 mmol/L (ref 98–111)
Creatinine, Ser: 0.63 mg/dL (ref 0.61–1.24)
GFR, Estimated: >60 mL/min (ref >60)
Glucose, Bld: 94 mg/dL (ref 70–99)
Potassium: 3.8 mmol/L (ref 3.5–5.1)
Sodium: 140 mmol/L (ref 135–145)
Total Bilirubin: 0.6 mg/dL (ref 0.3–1.2)
Total Protein: 6.5 g/dL (ref 6.5–8.1)

## 2021-12-06 LAB — CBC
HCT: 42.5 % (ref 39.0–52.0)
Hemoglobin: 14 g/dL (ref 13.0–17.0)
MCH: 30.1 pg (ref 26.0–34.0)
MCHC: 32.9 g/dL (ref 30.0–36.0)
MCV: 91.4 fL (ref 80.0–100.0)
Platelets: 144 10*3/uL — ABNORMAL LOW (ref 150–400)
RBC: 4.65 MIL/uL (ref 4.22–5.81)
RDW: 13.2 % (ref 11.5–15.5)
WBC: 5.3 10*3/uL (ref 4.0–10.5)
nRBC: 0 % (ref 0.0–0.2)

## 2021-12-06 LAB — HIV ANTIBODY (ROUTINE TESTING W REFLEX): HIV Screen 4th Generation wRfx: NONREACTIVE

## 2021-12-06 LAB — GLUCOSE, CAPILLARY: Glucose-Capillary: 227 mg/dL — ABNORMAL HIGH (ref 70–99)

## 2021-12-06 MED ORDER — COVID-19MRNA BIVAL VACC PFIZER 30 MCG/0.3ML IM SUSP
0.3000 mL | Freq: Once | INTRAMUSCULAR | Status: AC
  Administered 2021-12-06: 0.3 mL via INTRAMUSCULAR
  Filled 2021-12-06: qty 0.3

## 2021-12-06 NOTE — Progress Notes (Signed)
Pt pulled tele off [facility] he is going home. Awaiting MD order and pt is aware. Cont with plan of care

## 2021-12-06 NOTE — TOC CM/SW Note (Signed)
  Transition of Care [facility]) Screening Note   Patient Details  Name: Russell Hayden Date of Birth: 05-24-1961   Transition of Care [facility]) CM/SW Contact:    Darleene Cleaver, LCSW Phone Number: 12/06/2021, 1:55 PM    Transition of Care Department [employer]) has reviewed patient and no TOC needs have been identified at this time. We will continue to monitor patient advancement through interdisciplinary progression rounds. If new patient transition needs arise, please place a TOC consult.

## 2021-12-06 NOTE — Progress Notes (Signed)
Discussed the importance of increasing water intake. Pt verbalized understanding of all d/c instruction give via AVS. Pt tolerated COVID vaccine well. No reactions noted.

## 2021-12-06 NOTE — Discharge Summary (Signed)
Physician Discharge Summary  Russell Hayden WUJ:[id number] DOB: [birth year] DOA: 12/04/2021  PCP: Merri Brunette, MD  Admit date: 12/04/2021 Discharge date: 12/06/2021  Admitted From: Home Disposition: Home  Recommendations for Outpatient Follow-up:  Follow up with PCP in 1-2 weeks Encouraged to increase oral intake as syncopal versus presyncopal event likely related to dehydration with orthostatic hypotension.  Home Health: No Equipment/Devices: None  Discharge Condition: Stable CODE STATUS: Full code Diet recommendation: Heart healthy diet  History of present illness:  HELIO LACK is a 60 year old male with past medical history significant for anxiety, hypothyroidism, hyperlipidemia who presented to [facility] ED on 12/8 following syncopal episode while playing golf.  Patient reports started feeling dizzy that would last for roughly 5 seconds, bending over made it worse in which she became unbearable and was taken home from the golf course by a friend.  While at home, he was moving about his house and had a reported syncopal episode.  He felt a tunnel vision and then passed out.  Denies hitting his head, although it was unwitnessed and he was unsure how long he was down.  He was able to get up immediately upon awakening.  He had tried to get in with his PCP but since was unable to he came to the ED for further evaluation.  No recent illnesses, no sick contacts.  No recent changes in medications.  In the ED, temperature 98.2 F, HR 65, RR 16, BP 120/72, SPO2 98% on room air.  Orthostatic vital signs were performed which were positive.  Sodium 142, potassium 3.6, chloride 106, CO2 28, glucose 91, BUN 19, creatinine 0.79.  High sensitive troponin 6> 6; within normal limits.  WBC 5.9, hemoglobin 14.4, platelets 152.  COVID-19 PCR negative.  Influenza A/B PCR negative.  Chest x-ray with no active cardiopulmonary disease process.  CT head without contrast with no acute intracranial process.  CTA head/neck  with atherosclerosis minimal for age, otherwise negative CTA head/neck.  Patient was started on IV fluids given his orthostasis.  Hospital service was consulted for further evaluation management of syncope likely secondary to orthostatic hypotension/dehydration.   Hospital course:  Syncope Orthostatic hypotension Patient presenting to the ED following episode of syncope at home.  Was playing golf and started feeling weak/dizzy with associated tunnel vision.  Episode was unwitnessed, but denied any bowel/urinary incontinence.  No recent illnesses, no recent medication changes.  Although patient is on sertraline which can cause dizziness as well.  Patient was known to be orthostatic per vital signs in ED.  Patient was started on IV fluid hydration with improvement of his symptoms.  CT head and CTA head/neck unrevealing.  Patient is afebrile without leukocytosis and chest x-ray unrevealing.  Patient was monitored on telemetry during hospitalization without any signs of concerning arrhythmia.  TTE with LVEF 55-60%, no LV regional wall motion abnormalities, mild LVH, no aortic stenosis.  Now that symptoms have resolved, patient wishes for discharge home.  Discussed need to continue to maintain adequate hydration even in the wintertime when playing golf multiple times a week.  Outpatient follow-up with PCP.  Hyperlipidemia: Crestor 40 mg p.o. daily  Hypothyroidism: Levothyroxine 50 mcg p.o. daily  Anxiety: Sertraline 200 mg p.o. daily  Discharge Diagnoses:  Active Problems:   Orthostatic hypotension   Dehydration    Discharge Instructions  Discharge Instructions     Call MD for:  difficulty breathing, headache or visual disturbances   Complete by: As directed    Call MD for:  extreme fatigue   Complete by: As directed    Call MD for:  persistant dizziness or light-headedness   Complete by: As directed    Call MD for:  persistant nausea and vomiting   Complete by: As directed    Call MD  for:  severe uncontrolled pain   Complete by: As directed    Call MD for:  temperature >100.4   Complete by: As directed    Diet - low sodium heart healthy   Complete by: As directed    Increase activity slowly   Complete by: As directed       Allergies as of 12/06/2021       Reactions   Penicillin G    Other reaction(s): Unknown   Penicillins    Unknown childhood reaction        Medication List     TAKE these medications    diclofenac 75 MG EC tablet Commonly known as: VOLTAREN Take 75 mg by mouth daily.   FIBER PO Take 1 capsule by mouth daily as needed (for constipation).   levothyroxine 50 MCG tablet Commonly known as: SYNTHROID Take 50 mcg by mouth daily.   Mens 50+ Multi Vitamin/Min Tabs Take 1 tablet by mouth daily.   Mounjaro 5 MG/0.5ML Pen Generic drug: tirzepatide Inject 5 mg into the skin every Sunday.   rosuvastatin 40 MG tablet Commonly known as: CRESTOR Take 40 mg by mouth daily.   sertraline 100 MG tablet Commonly known as: ZOLOFT Take 200 mg by mouth daily.        Follow-up Information     Merri Brunette, MD. Schedule an appointment as soon as possible for a visit in 1 week(s).   Specialty: Internal Medicine Contact information: [address] [place] [address] [place] [address] [phone]                Allergies  Allergen Reactions   Penicillin G     Other reaction(s): Unknown   Penicillins     Unknown childhood reaction    Consultations: None   Procedures/Studies: CT Angio Head W or Wo Contrast  Result Date: 12/05/2021 CLINICAL DATA:  60 year old male with recurrent syncope. EXAM: CT ANGIOGRAPHY HEAD AND NECK TECHNIQUE: Multidetector CT imaging of the head and neck was performed using the standard protocol during bolus administration of intravenous contrast. Multiplanar CT image reconstructions and MIPs were obtained to evaluate the vascular anatomy. Carotid stenosis measurements (when applicable) are  obtained utilizing NASCET criteria, using the distal internal carotid diameter as the denominator. CONTRAST:  80mL OMNIPAQUE IOHEXOL 350 MG/ML SOLN COMPARISON:  Head CT 12/04/2021. FINDINGS: CT HEAD Brain: Cerebral volume is within normal limits for age. No midline shift, ventriculomegaly, mass effect, evidence of mass lesion, intracranial hemorrhage or evidence of cortically based acute infarction. Gray-white matter differentiation is within normal limits throughout the brain. Calvarium and skull base: Negative. Paranasal sinuses: Maxillary mucoperiosteal thickening partially visible greater on the left. Scattered ethmoid sinus mucosal thickening and opacification again noted. But no sinus fluid levels. Tympanic cavities and mastoids are clear. Orbits: Visualized orbits and scalp soft tissues are within normal limits. CTA NECK Skeleton: No acute osseous abnormality identified. Age-appropriate cervical spine degeneration. Upper chest: Negative. Other neck: Negative. Aortic arch: 3 vessel arch configuration. Minimal arch atherosclerosis. Right carotid system: Mild streak artifact from external metal. No significant right carotid atherosclerosis or stenosis. Left carotid system: Negative aside from similar mild streak artifact. Vertebral arteries: Minimal soft plaque at the proximal right subclavian artery  without stenosis. Normal right vertebral artery origin. The right vertebral appears mildly dominant, patent and normal to the skull base. Normal proximal left subclavian artery and left vertebral artery origin. Non dominant left vertebral artery is patent to the skull base without plaque or stenosis. CTA HEAD Posterior circulation: Normal distal vertebral arteries and vertebrobasilar junction. Dominant right V4 segment. Normal PICA origins. Patent mildly tortuous basilar artery without stenosis. Patent SCA and PCA origins. Posterior communicating arteries are diminutive or absent. Bilateral PCA branches are within  normal limits. Anterior circulation: Both ICA siphons are patent without stenosis. There is minimal calcified plaque of the right ICA siphon near the anterior genu. Patent carotid termini. Normal MCA and ACA origins. Anterior communicating artery and bilateral ACA branches are within normal limits. Left MCA M1 segment and bifurcation are patent without stenosis. Right MCA M1 segment and bifurcation are patent without stenosis. Bilateral MCA branches appear normal. Venous sinuses: Patent. Anatomic variants: Dominant right vertebral artery. Review of the MIP images confirms the above findings IMPRESSION: 1. Atherosclerosis is minimal for age.  Negative CTA Head and Neck. 2. Normal for age CT appearance of the brain. 3. Bilateral paranasal sinus disease. Electronically Signed   By: Odessa Fleming M.D.   On: 12/05/2021 07:45   DG Chest 2 View  Result Date: 12/04/2021 CLINICAL DATA:  Weakness. EXAM: CHEST - 2 VIEW COMPARISON:  Chest x-ray 07/03/2014. FINDINGS: The heart size and mediastinal contours are within normal limits. Both lungs are clear. The visualized skeletal structures are unremarkable. IMPRESSION: No active cardiopulmonary disease. Electronically Signed   By: Darliss Cheney M.D.   On: 12/04/2021 18:55   CT Head Wo Contrast  Result Date: 12/04/2021 CLINICAL DATA:  Dizziness EXAM: CT HEAD WITHOUT CONTRAST TECHNIQUE: Contiguous axial images were obtained from the base of the skull through the vertex without intravenous contrast. COMPARISON:  None. FINDINGS: Brain: No acute intracranial hemorrhage, mass effect, or herniation. No extra-axial fluid collections. No evidence of acute territorial infarct. No hydrocephalus. Vascular: No hyperdense vessel or unexpected calcification. Skull: Normal. Negative for fracture or focal lesion. Sinuses/Orbits: No acute finding. Other: None. IMPRESSION: No acute intracranial process identified. Electronically Signed   By: Jannifer Hick M.D.   On: 12/04/2021 19:01   CT  Angio Neck W and/or Wo Contrast  Result Date: 12/05/2021 CLINICAL DATA:  60 year old male with recurrent syncope. EXAM: CT ANGIOGRAPHY HEAD AND NECK TECHNIQUE: Multidetector CT imaging of the head and neck was performed using the standard protocol during bolus administration of intravenous contrast. Multiplanar CT image reconstructions and MIPs were obtained to evaluate the vascular anatomy. Carotid stenosis measurements (when applicable) are obtained utilizing NASCET criteria, using the distal internal carotid diameter as the denominator. CONTRAST:  80mL OMNIPAQUE IOHEXOL 350 MG/ML SOLN COMPARISON:  Head CT 12/04/2021. FINDINGS: CT HEAD Brain: Cerebral volume is within normal limits for age. No midline shift, ventriculomegaly, mass effect, evidence of mass lesion, intracranial hemorrhage or evidence of cortically based acute infarction. Gray-white matter differentiation is within normal limits throughout the brain. Calvarium and skull base: Negative. Paranasal sinuses: Maxillary mucoperiosteal thickening partially visible greater on the left. Scattered ethmoid sinus mucosal thickening and opacification again noted. But no sinus fluid levels. Tympanic cavities and mastoids are clear. Orbits: Visualized orbits and scalp soft tissues are within normal limits. CTA NECK Skeleton: No acute osseous abnormality identified. Age-appropriate cervical spine degeneration. Upper chest: Negative. Other neck: Negative. Aortic arch: 3 vessel arch configuration. Minimal arch atherosclerosis. Right carotid system: Mild streak artifact from external  metal. No significant right carotid atherosclerosis or stenosis. Left carotid system: Negative aside from similar mild streak artifact. Vertebral arteries: Minimal soft plaque at the proximal right subclavian artery without stenosis. Normal right vertebral artery origin. The right vertebral appears mildly dominant, patent and normal to the skull base. Normal proximal left subclavian  artery and left vertebral artery origin. Non dominant left vertebral artery is patent to the skull base without plaque or stenosis. CTA HEAD Posterior circulation: Normal distal vertebral arteries and vertebrobasilar junction. Dominant right V4 segment. Normal PICA origins. Patent mildly tortuous basilar artery without stenosis. Patent SCA and PCA origins. Posterior communicating arteries are diminutive or absent. Bilateral PCA branches are within normal limits. Anterior circulation: Both ICA siphons are patent without stenosis. There is minimal calcified plaque of the right ICA siphon near the anterior genu. Patent carotid termini. Normal MCA and ACA origins. Anterior communicating artery and bilateral ACA branches are within normal limits. Left MCA M1 segment and bifurcation are patent without stenosis. Right MCA M1 segment and bifurcation are patent without stenosis. Bilateral MCA branches appear normal. Venous sinuses: Patent. Anatomic variants: Dominant right vertebral artery. Review of the MIP images confirms the above findings IMPRESSION: 1. Atherosclerosis is minimal for age.  Negative CTA Head and Neck. 2. Normal for age CT appearance of the brain. 3. Bilateral paranasal sinus disease. Electronically Signed   By: Odessa Fleming M.D.   On: 12/05/2021 07:45   ECHOCARDIOGRAM COMPLETE  Result Date: 12/05/2021    ECHOCARDIOGRAM REPORT   Patient Name:   Lamberto A Benard Date of Exam: 12/05/2021 Medical Rec #:  [id number]     Height:       69.5 in Accession #:    [id number]    Weight:       188.0 lb Date of Birth:  [birth year]      BSA:          2.022 m Patient Age:    60 years      BP:           126/73 mmHg Patient Gender: M             HR:           68 bpm. Exam Location:  Inpatient Procedure: 2D Echo, Cardiac Doppler and Color Doppler Indications:    Syncope  History:        Patient has no prior history of Echocardiogram examinations.  Sonographer:    Vanetta Shawl Referring Phys: [id number] Delila Pereyra A KYLE IMPRESSIONS  1.  Left ventricular ejection fraction, by estimation, is 55 to 60%. The left ventricle has normal function. The left ventricle has no regional wall motion abnormalities. There is mild left ventricular hypertrophy. Left ventricular diastolic parameters were normal.  2. Right ventricular systolic function is normal. The right ventricular size is normal. Tricuspid regurgitation signal is inadequate for assessing PA pressure.  3. The mitral valve is grossly normal. No evidence of mitral valve regurgitation.  4. The aortic valve is grossly normal. Aortic valve regurgitation is not visualized.  5. The inferior vena cava is normal in size with greater than 50% respiratory variability, suggesting right atrial pressure of 3 mmHg. Comparison(s): No prior Echocardiogram. Conclusion(s)/Recommendation(s): Otherwise normal echocardiogram, with minor abnormalities described in the report. FINDINGS  Left Ventricle: Left ventricular ejection fraction, by estimation, is 55 to 60%. The left ventricle has normal function. The left ventricle has no regional wall motion abnormalities. The left ventricular internal cavity size was normal in size. There is  mild left ventricular hypertrophy. Left ventricular diastolic parameters were normal. Right Ventricle: The right ventricular size is normal. No increase in right ventricular wall thickness. Right ventricular systolic function is normal. Tricuspid regurgitation signal is inadequate for assessing PA pressure. Left Atrium: Left atrial size was normal in size. Right Atrium: Right atrial size was normal in size. Pericardium: There is no evidence of pericardial effusion. Mitral Valve: The mitral valve is grossly normal. No evidence of mitral valve regurgitation. Tricuspid Valve: The tricuspid valve is grossly normal. Tricuspid valve regurgitation is not demonstrated. Aortic Valve: The aortic valve is grossly normal. Aortic valve regurgitation is not visualized. Aortic valve mean gradient  measures 3.0 mmHg. Aortic valve peak gradient measures 7.1 mmHg. Aortic valve area, by VTI measures 2.17 cm. Pulmonic Valve: The pulmonic valve was not well visualized. Pulmonic valve regurgitation is not visualized. Aorta: The aortic root and ascending aorta are structurally normal, with no evidence of dilitation. Venous: The inferior vena cava is normal in size with greater than 50% respiratory variability, suggesting right atrial pressure of 3 mmHg. IAS/Shunts: No atrial level shunt detected by color flow Doppler.  LEFT VENTRICLE PLAX 2D LVIDd:         5.00 cm      Diastology LVIDs:         3.00 cm      LV e' medial:    6.85 cm/s LV PW:         1.20 cm      LV E/e' medial:  9.8 LV IVS:        1.20 cm      LV e' lateral:   10.20 cm/s LVOT diam:     2.00 cm      LV E/e' lateral: 6.6 LV SV:         60 LV SV Index:   30 LVOT Area:     3.14 cm  LV Volumes (MOD) LV vol d, MOD A2C: 101.0 ml LV vol d, MOD A4C: 105.0 ml LV vol s, MOD A2C: 41.5 ml LV vol s, MOD A4C: 45.5 ml LV SV MOD A2C:     59.5 ml LV SV MOD A4C:     105.0 ml LV SV MOD BP:      61.9 ml RIGHT VENTRICLE RV Basal diam:  3.20 cm RV S prime:     12.70 cm/s LEFT ATRIUM             Index        RIGHT ATRIUM           Index LA diam:        3.70 cm 1.83 cm/m   RA Area:     18.00 cm LA Vol (A2C):   47.9 ml 23.69 ml/m  RA Volume:   52.70 ml  26.06 ml/m LA Vol (A4C):   47.7 ml 23.59 ml/m LA Biplane Vol: 52.6 ml 26.01 ml/m  AORTIC VALVE                    PULMONIC VALVE AV Area (Vmax):    2.22 cm     PV Vmax:       1.00 m/s AV Area (Vmean):   2.14 cm     PV Peak grad:  4.0 mmHg AV Area (VTI):     2.17 cm AV Vmax:           133.00 cm/s AV Vmean:          84.700 cm/s AV VTI:  0.277 m AV Peak Grad:      7.1 mmHg AV Mean Grad:      3.0 mmHg LVOT Vmax:         94.00 cm/s LVOT Vmean:        57.800 cm/s LVOT VTI:          0.191 m LVOT/AV VTI ratio: 0.69  AORTA Ao Root diam: 3.20 cm Ao Asc diam:  3.10 cm MITRAL VALVE MV Area (PHT): 4.06 cm    SHUNTS MV  Decel Time: 187 msec    Systemic VTI:  0.19 m MV E velocity: 67.30 cm/s  Systemic Diam: 2.00 cm MV A velocity: 65.60 cm/s MV E/A ratio:  1.03 Photographer signed by Carolan Clines Signature Date/Time: 12/05/2021/3:21:15 PM    Final      Subjective: Patient seen examined bedside, resting comfortably.  Reports symptoms have resolved.  Ready for discharge home.  Discussed need to maintain adequate hydration even in the wintertime while exerting himself/playing golf multiple times a week.  No other questions or concerns at this time.  Denies headache, no dizziness currently, no chest pain, no palpitations, no shortness of breath, no abdominal pain, no weakness, no fatigue, no fever/chills/night sweats, no nausea/vomiting/diarrhea, no paresthesias.  No acute events overnight per nursing staff.  Discharge Exam: Vitals:   12/06/21 1244 12/06/21 1245  BP: 127/79 115/87  Pulse: 69 76  Resp:    Temp:    SpO2: 100% 91%   Vitals:   12/06/21 0509 12/06/21 1242 12/06/21 1244 12/06/21 1245  BP: 120/76 135/78 127/79 115/87  Pulse: 68 75 69 76  Resp: 15 19    Temp: 97.9 F (36.6 C) (!) 97.3 F (36.3 C)    TempSrc: Oral Oral    SpO2: 98% 98% 100% 91%  Weight:      Height:        General: Pt is alert, awake, not in acute distress Cardiovascular: RRR, S1/S2 +, no rubs, no gallops Respiratory: CTA bilaterally, no wheezing, no rhonchi, on room air Abdominal: Soft, NT, ND, bowel sounds + Extremities: no edema, no cyanosis    The results of significant diagnostics from this hospitalization (including imaging, microbiology, ancillary and laboratory) are listed below for reference.     Microbiology: Recent Results (from the past 240 hour(s))  Resp Panel by RT-PCR (Flu A&B, Covid) Nasopharyngeal Swab     Status: None   Collection Time: 12/05/21  5:50 AM   Specimen: Nasopharyngeal Swab; Nasopharyngeal(NP) swabs in vial transport medium  Result Value Ref Range Status   SARS Coronavirus 2  by RT PCR NEGATIVE NEGATIVE Final    Comment: (NOTE) SARS-CoV-2 target nucleic acids are NOT DETECTED.  The SARS-CoV-2 RNA is generally detectable in upper respiratory specimens during the acute phase of infection. The lowest concentration of SARS-CoV-2 viral copies this assay can detect is 138 copies/mL. A negative result does not preclude SARS-Cov-2 infection and should not be used as the sole basis for treatment or other patient management decisions. A negative result may occur with  improper specimen collection/handling, submission of specimen other than nasopharyngeal swab, presence of viral mutation(s) within the areas targeted by this assay, and inadequate number of viral copies(<138 copies/mL). A negative result must be combined with clinical observations, patient history, and epidemiological information. The expected result is Negative.  Fact Sheet for Patients:  BloggerCourse.com  Fact Sheet for Healthcare Providers:  SeriousBroker.it  This test is no t yet approved or cleared by the [place] FDA  and  has been authorized for detection and/or diagnosis of SARS-CoV-2 by FDA under an Emergency Use Authorization (EUA). This EUA will remain  in effect (meaning this test can be used) for the duration of the COVID-19 declaration under Section 564(b)(1) of the Act, 21 U.S.C.section 360bbb-3(b)(1), unless the authorization is terminated  or revoked sooner.       Influenza A by PCR NEGATIVE NEGATIVE Final   Influenza B by PCR NEGATIVE NEGATIVE Final    Comment: (NOTE) The Xpert Xpress SARS-CoV-2/FLU/RSV plus assay is intended as an aid in the diagnosis of influenza from Nasopharyngeal swab specimens and should not be used as a sole basis for treatment. Nasal washings and aspirates are unacceptable for Xpert Xpress SARS-CoV-2/FLU/RSV testing.  Fact Sheet for Patients: BloggerCourse.com  Fact  Sheet for Healthcare Providers: SeriousBroker.it  This test is not yet approved or cleared by the [place] FDA and has been authorized for detection and/or diagnosis of SARS-CoV-2 by FDA under an Emergency Use Authorization (EUA). This EUA will remain in effect (meaning this test can be used) for the duration of the COVID-19 declaration under Section 564(b)(1) of the Act, 21 U.S.C. section 360bbb-3(b)(1), unless the authorization is terminated or revoked.  Performed at [facility], [address], [place], [place] [address]      Labs: BNP (last 3 results) No results for input(s): BNP in the last 8760 hours. Basic Metabolic Panel: Recent Labs  Lab 12/04/21 1918 12/06/21 0531  NA 142 140  K 3.6 3.8  CL 106 109  CO2 28 25  GLUCOSE 91 94  BUN 19 14  CREATININE 0.79 0.63  CALCIUM 9.5 8.8*   Liver Function Tests: Recent Labs  Lab 12/04/21 1918 12/06/21 0531  AST 23 20  ALT 35 34  ALKPHOS 51 46  BILITOT 0.5 0.6  PROT 6.9 6.5  ALBUMIN 4.7 4.0   No results for input(s): LIPASE, AMYLASE in the last 168 hours. No results for input(s): AMMONIA in the last 168 hours. CBC: Recent Labs  Lab 12/02/21 0954 12/04/21 1918 12/06/21 0531  WBC 5.2 5.9 5.3  HGB 14.8 14.4 14.0  HCT 44.1 44.2 42.5  MCV 92.3 92.9 91.4  PLT 152 152 144*   Cardiac Enzymes: No results for input(s): CKTOTAL, CKMB, CKMBINDEX, TROPONINI in the last 168 hours. BNP: Invalid input(s): POCBNP CBG: Recent Labs  Lab 12/02/21 0947 12/04/21 1916 12/06/21 0803  GLUCAP 94 79 227*   D-Dimer Recent Labs    12/04/21 2358  DDIMER 0.43   Hgb A1c No results for input(s): HGBA1C in the last 72 hours. Lipid Profile No results for input(s): CHOL, HDL, LDLCALC, TRIG, CHOLHDL, LDLDIRECT in the last 72 hours. Thyroid function studies No results for input(s): TSH, T4TOTAL, T3FREE, THYROIDAB in the last 72 hours.  Invalid input(s): FREET3 Anemia work  up No results for input(s): VITAMINB12, FOLATE, FERRITIN, TIBC, IRON, RETICCTPCT in the last 72 hours. Urinalysis    Component Value Date/Time   COLORURINE YELLOW 12/05/2021 0332   APPEARANCEUR CLEAR 12/05/2021 0332   LABSPEC >1.030 (H) 12/05/2021 0332   PHURINE 5.5 12/05/2021 0332   GLUCOSEU NEGATIVE 12/05/2021 0332   HGBUR NEGATIVE 12/05/2021 0332   BILIRUBINUR NEGATIVE 12/05/2021 0332   KETONESUR NEGATIVE 12/05/2021 0332   PROTEINUR NEGATIVE 12/05/2021 0332   UROBILINOGEN 0.2 07/03/2014 1923   NITRITE NEGATIVE 12/05/2021 0332   LEUKOCYTESUR NEGATIVE 12/05/2021 0332   Sepsis Labs Invalid input(s): PROCALCITONIN,  WBC,  LACTICIDVEN Microbiology Recent Results (from the past 240 hour(s))  Resp Panel by RT-PCR (Flu A&B, Covid) Nasopharyngeal Swab     Status: None   Collection Time: 12/05/21  5:50 AM   Specimen: Nasopharyngeal Swab; Nasopharyngeal(NP) swabs in vial transport medium  Result Value Ref Range Status   SARS Coronavirus 2 by RT PCR NEGATIVE NEGATIVE Final    Comment: (NOTE) SARS-CoV-2 target nucleic acids are NOT DETECTED.  The SARS-CoV-2 RNA is generally detectable in upper respiratory specimens during the acute phase of infection. The lowest concentration of SARS-CoV-2 viral copies this assay can detect is 138 copies/mL. A negative result does not preclude SARS-Cov-2 infection and should not be used as the sole basis for treatment or other patient management decisions. A negative result may occur with  improper specimen collection/handling, submission of specimen other than nasopharyngeal swab, presence of viral mutation(s) within the areas targeted by this assay, and inadequate number of viral copies(<138 copies/mL). A negative result must be combined with clinical observations, patient history, and epidemiological information. The expected result is Negative.  Fact Sheet for Patients:  BloggerCourse.com  Fact Sheet for Healthcare  Providers:  SeriousBroker.it  This test is no t yet approved or cleared by the [place] FDA and  has been authorized for detection and/or diagnosis of SARS-CoV-2 by FDA under an Emergency Use Authorization (EUA). This EUA will remain  in effect (meaning this test can be used) for the duration of the COVID-19 declaration under Section 564(b)(1) of the Act, 21 U.S.C.section 360bbb-3(b)(1), unless the authorization is terminated  or revoked sooner.       Influenza A by PCR NEGATIVE NEGATIVE Final   Influenza B by PCR NEGATIVE NEGATIVE Final    Comment: (NOTE) The Xpert Xpress SARS-CoV-2/FLU/RSV plus assay is intended as an aid in the diagnosis of influenza from Nasopharyngeal swab specimens and should not be used as a sole basis for treatment. Nasal washings and aspirates are unacceptable for Xpert Xpress SARS-CoV-2/FLU/RSV testing.  Fact Sheet for Patients: BloggerCourse.com  Fact Sheet for Healthcare Providers: SeriousBroker.it  This test is not yet approved or cleared by the [place] FDA and has been authorized for detection and/or diagnosis of SARS-CoV-2 by FDA under an Emergency Use Authorization (EUA). This EUA will remain in effect (meaning this test can be used) for the duration of the COVID-19 declaration under Section 564(b)(1) of the Act, 21 U.S.C. section 360bbb-3(b)(1), unless the authorization is terminated or revoked.  Performed at [facility], [address], [place], [place] [address]      Time coordinating discharge: Over 30 minutes  SIGNED:   Alvira Philips [place], DO  [facility] 12/06/2021, 1:24 PM

## 2021-12-10 ENCOUNTER — Ambulatory Visit (HOSPITAL_COMMUNITY): Admission: RE | Admit: 2021-12-10 | Payer: 59 | Source: Home / Self Care | Admitting: Surgery

## 2021-12-10 ENCOUNTER — Encounter (HOSPITAL_COMMUNITY): Admission: RE | Payer: Self-pay | Source: Home / Self Care

## 2021-12-10 SURGERY — EXAM UNDER ANESTHESIA WITH HEMORRHOIDECTOMY
Anesthesia: General

## 2022-08-05 ENCOUNTER — Other Ambulatory Visit: Payer: Self-pay | Admitting: Registered Nurse

## 2022-08-05 DIAGNOSIS — Z8249 Family history of ischemic heart disease and other diseases of the circulatory system: Secondary | ICD-10-CM

## 2022-08-17 ENCOUNTER — Ambulatory Visit
Admission: RE | Admit: 2022-08-17 | Discharge: 2022-08-17 | Disposition: A | Discharge status: Home / Self Care | Payer: No Typology Code available for payment source | Source: Ambulatory Visit | Attending: Registered Nurse | Admitting: Registered Nurse

## 2022-08-17 DIAGNOSIS — Z8249 Family history of ischemic heart disease and other diseases of the circulatory system: Secondary | ICD-10-CM

## 2022-11-29 ENCOUNTER — Emergency Department (HOSPITAL_BASED_OUTPATIENT_CLINIC_OR_DEPARTMENT_OTHER): Payer: 59 | Admitting: Radiology

## 2022-11-29 ENCOUNTER — Emergency Department (HOSPITAL_BASED_OUTPATIENT_CLINIC_OR_DEPARTMENT_OTHER)
Admission: EM | Admit: 2022-11-29 | Discharge: 2022-11-29 | Disposition: A | Discharge status: Home / Self Care | Payer: 59 | Attending: Emergency Medicine | Admitting: Emergency Medicine

## 2022-11-29 ENCOUNTER — Encounter (HOSPITAL_BASED_OUTPATIENT_CLINIC_OR_DEPARTMENT_OTHER): Payer: Self-pay | Admitting: Emergency Medicine

## 2022-11-29 DIAGNOSIS — S61213A Laceration without foreign body of left middle finger without damage to nail, initial encounter: Secondary | ICD-10-CM | POA: Diagnosis present

## 2022-11-29 DIAGNOSIS — Z23 Encounter for immunization: Secondary | ICD-10-CM | POA: Diagnosis not present

## 2022-11-29 DIAGNOSIS — Z79890 Hormone replacement therapy (postmenopausal): Secondary | ICD-10-CM | POA: Insufficient documentation

## 2022-11-29 DIAGNOSIS — S62633B Displaced fracture of distal phalanx of left middle finger, initial encounter for open fracture: Secondary | ICD-10-CM | POA: Insufficient documentation

## 2022-11-29 DIAGNOSIS — S62639B Displaced fracture of distal phalanx of unspecified finger, initial encounter for open fracture: Secondary | ICD-10-CM

## 2022-11-29 DIAGNOSIS — W270XXA Contact with workbench tool, initial encounter: Secondary | ICD-10-CM | POA: Diagnosis not present

## 2022-11-29 DIAGNOSIS — E039 Hypothyroidism, unspecified: Secondary | ICD-10-CM | POA: Diagnosis not present

## 2022-11-29 DIAGNOSIS — S61309A Unspecified open wound of unspecified finger with damage to nail, initial encounter: Secondary | ICD-10-CM

## 2022-11-29 MED ORDER — TETANUS-DIPHTH-ACELL PERTUSSIS 5-2.5-18.5 LF-MCG/0.5 IM SUSY
0.5000 mL | PREFILLED_SYRINGE | Freq: Once | INTRAMUSCULAR | Status: AC
Start: 2022-11-29 — End: 2022-11-29
  Administered 2022-11-29: 0.5 mL via INTRAMUSCULAR
  Filled 2022-11-29: qty 0.5

## 2022-11-29 MED ORDER — HYDROCODONE-ACETAMINOPHEN 5-325 MG PO TABS: 2.0000 | ORAL_TABLET | ORAL | 0 refills | Status: AC | PRN

## 2022-11-29 MED ORDER — CEPHALEXIN 250 MG PO CAPS
500.0000 mg | ORAL_CAPSULE | Freq: Once | ORAL | Status: AC
  Administered 2022-11-29: 500 mg via ORAL
  Filled 2022-11-29: qty 2

## 2022-11-29 MED ORDER — HYDROCODONE-ACETAMINOPHEN 5-325 MG PO TABS
1.0000 | ORAL_TABLET | Freq: Once | ORAL | Status: AC
  Administered 2022-11-29: 1 via ORAL
  Filled 2022-11-29: qty 1

## 2022-11-29 MED ORDER — CEPHALEXIN 500 MG PO CAPS: 500.0000 mg | ORAL_CAPSULE | Freq: Four times a day (QID) | ORAL | 0 refills | Status: AC

## 2022-11-29 MED ORDER — LIDOCAINE HCL 2 % IJ SOLN
10.0000 mL | Freq: Once | INTRAMUSCULAR | Status: AC
Start: 2022-11-29 — End: 2022-11-29
  Administered 2022-11-29: 200 mg
  Filled 2022-11-29: qty 20

## 2022-11-29 NOTE — ED Notes (Signed)
Lido given by the Provider

## 2022-11-29 NOTE — Discharge Instructions (Addendum)
Keep the finger bandaged and elevated for any type of throbbing.  You have had a dose of pain medicine and antibiotic here so you do not need anything till the morning.  Call [facility] in the morning.

## 2022-11-29 NOTE — ED Triage Notes (Signed)
Pt here from a lac to the middle finger from a table saw , bleeding controlled unknown t dap

## 2022-11-29 NOTE — ED Provider Notes (Signed)
[facility] EMERGENCY DEPT Provider Note   CSN: [id number] Arrival date & time: 11/29/22  1547     History  Chief Complaint  Patient presents with   Finger Injury    Russell Hayden is a 61 y.o. male.  Patient is a 61 year old male with a history of hypothyroidism, idiopathic thrombocytopenia and prediabetes who is presenting today after an injury at home with his table saw.  His finger slipped and the saw cut his left middle finger.  It ripped the nail completely off and it has been bleeding ever since.  He is unsure when his last tetanus shot was.  He does not take any anticoagulants.  The history is provided by the patient.       Home Medications Prior to Admission medications   Medication Sig Start Date End Date Taking? Authorizing Provider  cephALEXin (KEFLEX) 500 MG capsule Take 1 capsule (500 mg total) by mouth 4 (four) times daily. 11/29/22  Yes Yosselin Zoeller, Alphonzo Lemmings, MD  HYDROcodone-acetaminophen (NORCO/VICODIN) 5-325 MG tablet Take 2 tablets by mouth every 4 (four) hours as needed. 11/29/22  Yes Antony Sian, Alphonzo Lemmings, MD  diclofenac (VOLTAREN) 75 MG EC tablet Take 75 mg by mouth daily.    [provider]  FIBER PO Take 1 capsule by mouth daily as needed (for constipation).    [provider]  levothyroxine (SYNTHROID) 50 MCG tablet Take 50 mcg by mouth daily. 09/18/21   [provider]  Multiple Vitamins-Minerals (MENS 50+ MULTI VITAMIN/MIN) TABS Take 1 tablet by mouth daily.    [provider]  rosuvastatin (CRESTOR) 40 MG tablet Take 40 mg by mouth daily.    [provider]  sertraline (ZOLOFT) 100 MG tablet Take 200 mg by mouth daily.    [provider]  tirzepatide Greggory Keen) 5 MG/0.5ML Pen Inject 5 mg into the skin every Sunday.    [provider]      Allergies    Penicillin g and Penicillins    Review of Systems   Review of Systems  Physical Exam Updated Vital Signs BP 132/80 (BP Location:  Right Arm)   Pulse 70   Temp 98.5 F (36.9 C) (Oral)   Resp 18   SpO2 97%  Physical Exam Vitals and nursing note reviewed.  Cardiovascular:     Rate and Rhythm: Normal rate.  Pulmonary:     Effort: Pulmonary effort is normal.  Musculoskeletal:        General: Tenderness and signs of injury present.       Hands:  Neurological:     Mental Status: He is alert. Mental status is at baseline.  Psychiatric:        Mood and Affect: Mood normal.     ED Results / Procedures / Treatments   Labs (all labs ordered are listed, but only abnormal results are displayed) Labs Reviewed - No data to display  EKG None  Radiology DG Finger Middle Left  Result Date: 11/29/2022 CLINICAL DATA:  Table saw injury EXAM: LEFT MIDDLE FINGER 3V COMPARISON:  None Available. FINDINGS: Comminuted fracture through the distal phalanx of the long finger with associated soft tissue irregularity consistent with laceration. The remaining visualized bones and joints are unremarkable. IMPRESSION: Open comminuted fracture through the distal phalanx of the third finger consistent with table saw injury. The remaining visualized bones and joints are intact and unremarkable. Electronically Signed   By: Malachy Moan M.D.   On: 11/29/2022 17:24    Procedures Procedures  Medications Ordered in ED Medications  lidocaine (XYLOCAINE) 2 % (with pres) injection 200 mg (200 mg Infiltration Given by Other 11/29/22 2012)  Tdap (BOOSTRIX) injection 0.5 mL (0.5 mLs Intramuscular Given 11/29/22 2046)  HYDROcodone-acetaminophen (NORCO/VICODIN) 5-325 MG per tablet 1 tablet (1 tablet Oral Given 11/29/22 2047)  cephALEXin (KEFLEX) capsule 500 mg (500 mg Oral Given 11/29/22 2047)    ED Course/ Medical Decision Making/ A&P                           Medical Decision Making Amount and/or Complexity of Data Reviewed Radiology: ordered and independent interpretation performed. Decision-making details documented in ED  Course.  Risk Prescription drug management.   Patient presenting today after cutting his finger with a table saw.  It completely avulsed his nail with some subcutaneous tissue as well but no exposed bone.  I have independently visualized and interpreted pt's images today.  X-ray today with a distal phalanx fracture.  Will treat this as an open fracture.  Patient given Keflex.  No areas that can be sutured at this time.  Xeroform was placed in a bandage.  Tetanus was updated.  Patient to follow-up with hand surgery.  Given a short course of pain control as well.  Wound was irrigated and scrubbed thoroughly.         Final Clinical Impression(s) / ED Diagnoses Final diagnoses:  Nail avulsion, finger, initial encounter  Open fracture of tuft of distal phalanx of finger    Rx / DC Orders ED Discharge Orders          Ordered    cephALEXin (KEFLEX) 500 MG capsule  4 times daily        11/29/22 2034    HYDROcodone-acetaminophen (NORCO/VICODIN) 5-325 MG tablet  Every 4 hours PRN        11/29/22 2034              Gwyneth Sprout, MD 11/29/22 2057

## 2023-08-17 IMAGING — CT CT ANGIO HEAD
1 of 10 series · 6 of 33 positions shown · IV contrast (OMNIPAQUE 350)
Comparison: Head CT 12/04/2021.

CLINICAL DATA: 60-year-old male with recurrent syncope.



[Series 11: ax thin · axial · 0.39mm/px · z∈[-304,-46]mm · 6 of 362 slices shown]
[im 52/362  soft-tissue]
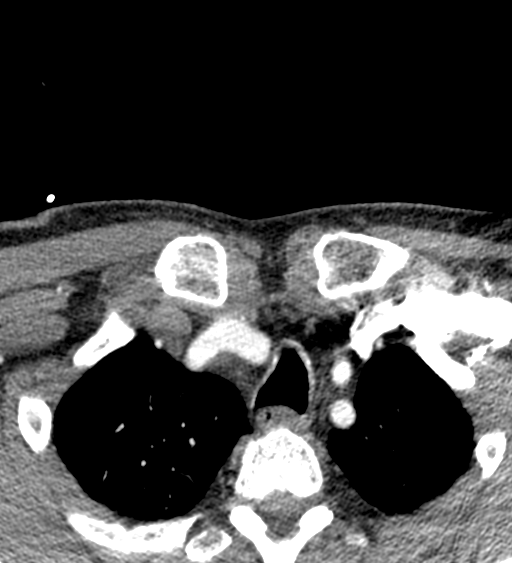
[im 104/362  bone]
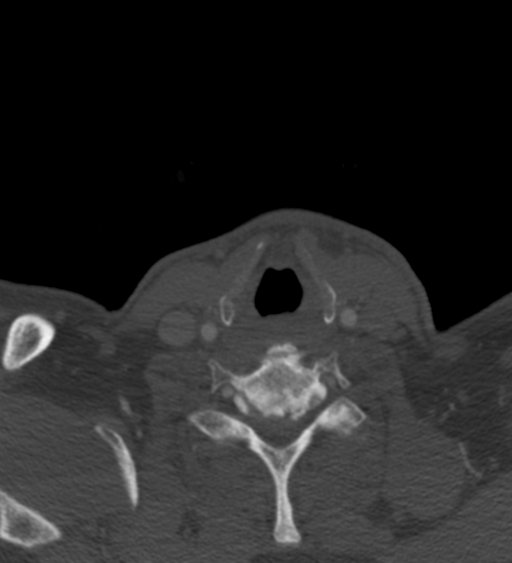
[im 155/362  soft-tissue]
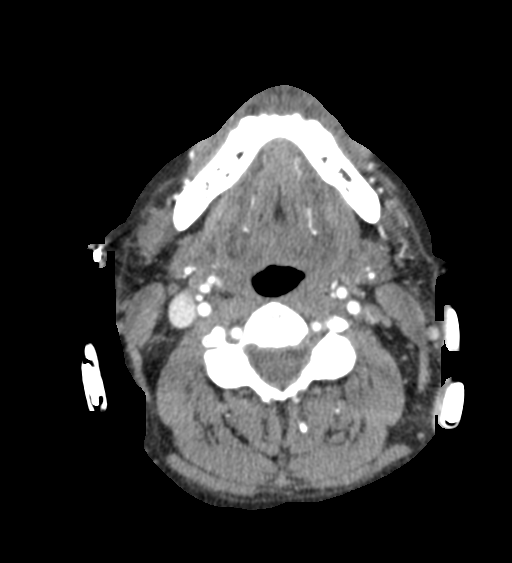
[im 207/362  bone]
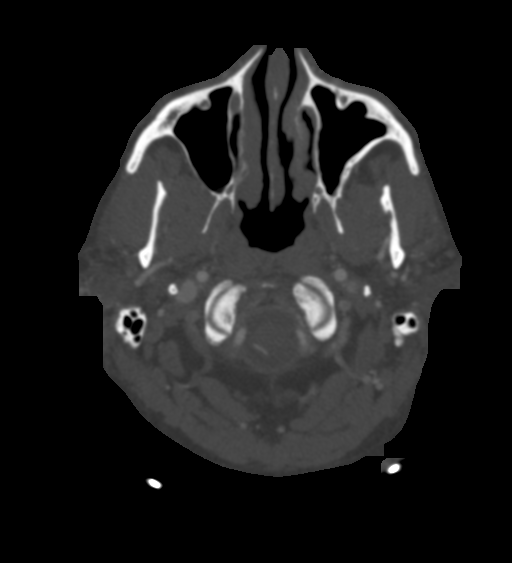
[im 258/362  soft-tissue]
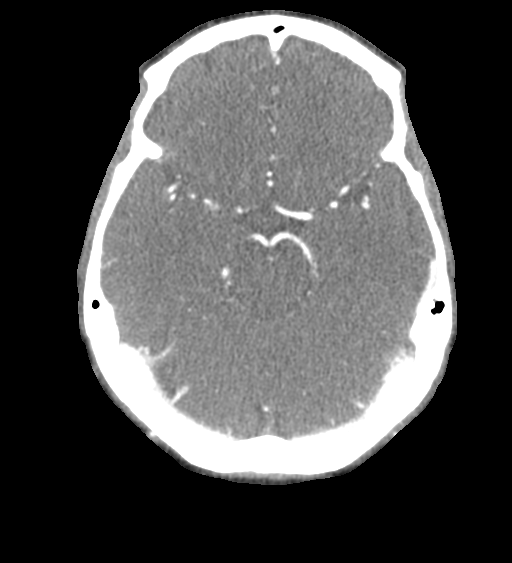
[im 310/362  bone]
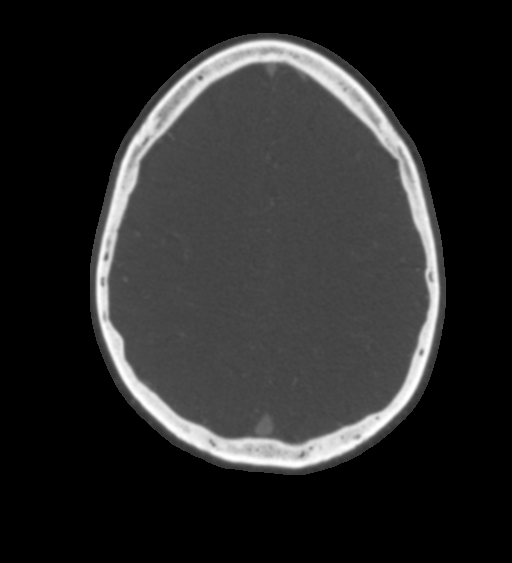

[6 of 33 positions shown; findings below may reference images not displayed]

FINDINGS: CT HEAD

Brain: Cerebral volume is within normal limits for age. No midline
shift, ventriculomegaly, mass effect, evidence of mass lesion,
intracranial hemorrhage or evidence of cortically based acute
infarction. Gray-white matter differentiation is within normal
limits throughout the brain.

Calvarium and skull base: Negative.

Paranasal sinuses: Maxillary mucoperiosteal thickening partially
visible greater on the left. Scattered ethmoid sinus mucosal
thickening and opacification again noted. But no sinus fluid levels.
Tympanic cavities and mastoids are clear.

Orbits: Visualized orbits and scalp soft tissues are within normal
limits.

CTA NECK

Skeleton: No acute osseous abnormality identified. Age-appropriate
cervical spine degeneration.

Upper chest: Negative.

Other neck: Negative.

Aortic arch: 3 vessel arch configuration. Minimal arch
atherosclerosis.

Right carotid system: Mild streak artifact from external metal. No
significant right carotid atherosclerosis or stenosis.

Left carotid system: Negative aside from similar mild streak
artifact.

Vertebral arteries:
Minimal soft plaque at the proximal right subclavian artery without
stenosis. Normal right vertebral artery origin. The right vertebral
appears mildly dominant, patent and normal to the skull base.

Normal proximal left subclavian artery and left vertebral artery
origin. Non dominant left vertebral artery is patent to the skull
base without plaque or stenosis.

CTA HEAD

Posterior circulation: Normal distal vertebral arteries and
vertebrobasilar junction. Dominant right V4 segment. Normal PICA
origins. Patent mildly tortuous basilar artery without stenosis.
Patent SCA and PCA origins. Posterior communicating arteries are
diminutive or absent. Bilateral PCA branches are within normal
limits.

Anterior circulation: Both ICA siphons are patent without stenosis.
There is minimal calcified plaque of the right ICA siphon near the
anterior genu. Patent carotid termini. Normal MCA and ACA origins.
Anterior communicating artery and bilateral ACA branches are within
normal limits. Left MCA M1 segment and bifurcation are patent
without stenosis. Right MCA M1 segment and bifurcation are patent
without stenosis. Bilateral MCA branches appear normal.

Venous sinuses: Patent.

Anatomic variants: Dominant right vertebral artery.

Review of the MIP images confirms the above findings
IMPRESSION: 1. Atherosclerosis is minimal for age.  Negative CTA Head and Neck.
2. Normal for age CT appearance of the brain.
3. Bilateral paranasal sinus disease.
